# Patient Record
Sex: Female | Born: 1980 | Race: White | Hispanic: No | Marital: Married | Smoking: Never smoker
Health system: Southern US, Community
[De-identification: ages and names within clinical notes are randomized; demographics above are authoritative.]

## PROBLEM LIST (undated history)

## (undated) DIAGNOSIS — D649 Anemia, unspecified: Secondary | ICD-10-CM

## (undated) DIAGNOSIS — M419 Scoliosis, unspecified: Secondary | ICD-10-CM

## (undated) DIAGNOSIS — T7840XA Allergy, unspecified, initial encounter: Secondary | ICD-10-CM

## (undated) HISTORY — DX: Anemia, unspecified: D64.9

## (undated) HISTORY — PX: WISDOM TOOTH EXTRACTION: SHX21

## (undated) HISTORY — DX: Scoliosis, unspecified: M41.9

## (undated) HISTORY — DX: Allergy, unspecified, initial encounter: T78.40XA

---

## 2019-02-01 ENCOUNTER — Emergency Department (HOSPITAL_BASED_OUTPATIENT_CLINIC_OR_DEPARTMENT_OTHER)
Admission: EM | Admit: 2019-02-01 | Discharge: 2019-02-01 | Disposition: A | Discharge status: Home / Self Care | Payer: Commercial Managed Care - PPO | Attending: Emergency Medicine | Admitting: Emergency Medicine

## 2019-02-01 ENCOUNTER — Other Ambulatory Visit: Payer: Self-pay

## 2019-02-01 ENCOUNTER — Encounter (HOSPITAL_BASED_OUTPATIENT_CLINIC_OR_DEPARTMENT_OTHER): Payer: Self-pay | Admitting: *Deleted

## 2019-02-01 DIAGNOSIS — Z5321 Procedure and treatment not carried out due to patient leaving prior to being seen by health care provider: Secondary | ICD-10-CM | POA: Insufficient documentation

## 2019-02-01 DIAGNOSIS — R109 Unspecified abdominal pain: Secondary | ICD-10-CM | POA: Diagnosis not present

## 2019-02-01 LAB — CBC WITH DIFFERENTIAL/PLATELET
Abs Immature Granulocytes: 0.01 10*3/uL (ref 0.00–0.07)
Basophils Absolute: 0 10*3/uL (ref 0.0–0.1)
Basophils Relative: 1 %
Eosinophils Absolute: 0 10*3/uL (ref 0.0–0.5)
Eosinophils Relative: 0 %
HCT: 44.3 % (ref 36.0–46.0)
Hemoglobin: 14.7 g/dL (ref 12.0–15.0)
Immature Granulocytes: 0 %
Lymphocytes Relative: 27 %
Lymphs Abs: 1.5 10*3/uL (ref 0.7–4.0)
MCH: 29.7 pg (ref 26.0–34.0)
MCHC: 33.2 g/dL (ref 30.0–36.0)
MCV: 89.5 fL (ref 80.0–100.0)
Monocytes Absolute: 0.2 10*3/uL (ref 0.1–1.0)
Monocytes Relative: 3 %
Neutro Abs: 3.9 10*3/uL (ref 1.7–7.7)
Neutrophils Relative %: 69 %
Platelets: 358 10*3/uL (ref 150–400)
RBC: 4.95 MIL/uL (ref 3.87–5.11)
RDW: 12.6 % (ref 11.5–15.5)
WBC: 5.7 10*3/uL (ref 4.0–10.5)
nRBC: 0 % (ref 0.0–0.2)

## 2019-02-01 LAB — BASIC METABOLIC PANEL
Anion gap: 11 (ref 5–15)
BUN: 11 mg/dL (ref 6–20)
CO2: 23 mmol/L (ref 22–32)
Calcium: 8.9 mg/dL (ref 8.9–10.3)
Chloride: 104 mmol/L (ref 98–111)
Creatinine, Ser: 0.75 mg/dL (ref 0.44–1.00)
GFR calc Af Amer: >60 mL/min (ref >60)
GFR calc non Af Amer: >60 mL/min (ref >60)
Glucose, Bld: 99 mg/dL (ref 70–99)
Potassium: 3.6 mmol/L (ref 3.5–5.1)
Sodium: 138 mmol/L (ref 135–145)

## 2019-02-01 LAB — URINALYSIS, ROUTINE W REFLEX MICROSCOPIC
Bilirubin Urine: NEGATIVE
Glucose, UA: NEGATIVE mg/dL
Ketones, ur: NEGATIVE mg/dL
Leukocytes,Ua: NEGATIVE
Nitrite: NEGATIVE
Protein, ur: NEGATIVE mg/dL
Specific Gravity, Urine: >1.03 — ABNORMAL HIGH (ref 1.005–1.030)
pH: 6 (ref 5.0–8.0)

## 2019-02-01 LAB — LIPASE, BLOOD: Lipase: 27 U/L (ref 11–51)

## 2019-02-01 LAB — URINALYSIS, MICROSCOPIC (REFLEX)

## 2019-02-01 NOTE — ED Triage Notes (Signed)
Abdominal pain. No diarrhea. Covid exposure.

## 2019-02-04 ENCOUNTER — Other Ambulatory Visit: Payer: Self-pay | Admitting: Nurse Practitioner

## 2019-02-04 DIAGNOSIS — R1013 Epigastric pain: Secondary | ICD-10-CM

## 2019-02-10 ENCOUNTER — Other Ambulatory Visit: Payer: Commercial Managed Care - PPO

## 2019-02-11 ENCOUNTER — Other Ambulatory Visit: Payer: Self-pay

## 2019-02-11 ENCOUNTER — Ambulatory Visit (INDEPENDENT_AMBULATORY_CARE_PROVIDER_SITE_OTHER): Payer: Commercial Managed Care - PPO

## 2019-02-11 DIAGNOSIS — R1013 Epigastric pain: Secondary | ICD-10-CM | POA: Diagnosis not present

## 2019-03-05 ENCOUNTER — Other Ambulatory Visit: Payer: Self-pay

## 2019-03-05 NOTE — Progress Notes (Signed)
Opened in error. -EH/RMA  

## 2019-03-08 ENCOUNTER — Other Ambulatory Visit: Payer: Self-pay | Admitting: Surgery

## 2019-03-08 DIAGNOSIS — H5319 Other subjective visual disturbances: Secondary | ICD-10-CM

## 2019-03-17 ENCOUNTER — Other Ambulatory Visit: Payer: Self-pay | Admitting: Surgery

## 2019-04-14 ENCOUNTER — Other Ambulatory Visit: Payer: Self-pay

## 2019-04-14 ENCOUNTER — Ambulatory Visit
Admission: RE | Admit: 2019-04-14 | Discharge: 2019-04-14 | Disposition: A | Discharge status: Home / Self Care | Payer: Commercial Managed Care - PPO | Source: Ambulatory Visit | Attending: Surgery | Admitting: Surgery

## 2019-04-14 DIAGNOSIS — H5319 Other subjective visual disturbances: Secondary | ICD-10-CM

## 2019-04-14 MED ORDER — GADOBENATE DIMEGLUMINE 529 MG/ML IV SOLN
11.0000 mL | Freq: Once | INTRAVENOUS | Status: AC | PRN
  Administered 2019-04-14: 11 mL via INTRAVENOUS

## 2019-09-15 ENCOUNTER — Encounter: Payer: Self-pay | Admitting: Family Medicine

## 2019-09-15 ENCOUNTER — Telehealth (INDEPENDENT_AMBULATORY_CARE_PROVIDER_SITE_OTHER): Payer: Commercial Managed Care - PPO | Admitting: Family Medicine

## 2019-09-15 DIAGNOSIS — N946 Dysmenorrhea, unspecified: Secondary | ICD-10-CM | POA: Diagnosis not present

## 2019-09-15 DIAGNOSIS — Z1159 Encounter for screening for other viral diseases: Secondary | ICD-10-CM

## 2019-09-15 DIAGNOSIS — Z131 Encounter for screening for diabetes mellitus: Secondary | ICD-10-CM

## 2019-09-15 DIAGNOSIS — R1013 Epigastric pain: Secondary | ICD-10-CM | POA: Insufficient documentation

## 2019-09-15 DIAGNOSIS — Z13220 Encounter for screening for lipoid disorders: Secondary | ICD-10-CM | POA: Diagnosis not present

## 2019-09-15 MED ORDER — NORETHIN ACE-ETH ESTRAD-FE 1-20 MG-MCG PO TABS: 1.0000 | ORAL_TABLET | Freq: Every day | ORAL | 2 refills | Status: DC

## 2019-09-15 NOTE — Progress Notes (Signed)
Virtual Visit via Video Note I connected with Taylor Walter on 09/15/19 by a video enabled telemedicine application and verified that I am speaking with the correct person using two identifiers.  Location patient: home Location provider:work office Persons participating in the virtual visit: patient, provider  I discussed the limitations of evaluation and management by telemedicine and the availability of in person appointments. The patient expressed understanding and agreed to proceed.   HPI: Taylor Walter is a 39 yo female establishing care today. 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 She is otherwise healthy except for occasional epigastric abdominal pain q 1-2 years, recently had her 4th episode. She follows with GI.  No hx of diabetes,HLD,or HTN. She does not have any specific concerns today except for refills on OCP's. On OCP's since her 61's. Tolerating well, no side effects.  She takes medication continuously, no menstrual periods. Hx of dysmenorrhea, and OCP's help. G:0. Due for pap smear in 01/2020.  She would like to arrange blood work.  ROS: See pertinent positives and negatives per HPI.  History reviewed. No pertinent past medical history.  Past Surgical History:  Procedure Laterality Date  . WISDOM TOOTH EXTRACTION     History reviewed. No pertinent family history.  Social History   Socioeconomic History  . Marital status: Married    Spouse name: Not on file  . Number of children: Not on file  . Years of education: Not on file  . Highest education level: Not on file  Occupational History  . Not on file  Tobacco Use  . Smoking status: Never Smoker  . Smokeless tobacco: Never Used  Substance and Sexual Activity  . Alcohol use: Never  . Drug use: Never  . Sexual activity: Not on file  Other Topics Concern  . Not on file  Social History Narrative  . Not on file   Social Determinants of Health   Financial Resource Strain:   . Difficulty of Paying Living  Expenses: Not on file  Food Insecurity:   . Worried About Programme researcher, broadcasting/film/video in the Last Year: Not on file  . Ran Out of Food in the Last Year: Not on file  Transportation Needs:   . Lack of Transportation (Medical): Not on file  . Lack of Transportation (Non-Medical): Not on file  Physical Activity:   . Days of Exercise per Week: Not on file  . Minutes of Exercise per Session: Not on file  Stress:   . Feeling of Stress : Not on file  Social Connections:   . Frequency of Communication with Friends and Family: Not on file  . Frequency of Social Gatherings with Friends and Family: Not on file  . Attends Religious Services: Not on file  . Active Member of Clubs or Organizations: Not on file  . Attends Banker Meetings: Not on file  . Marital Status: Not on file  Intimate Partner Violence:   . Fear of Current or Ex-Partner: Not on file  . Emotionally Abused: Not on file  . Physically Abused: Not on file  . Sexually Abused: Not on file    Current Outpatient Medications:  .  norethindrone-ethinyl estradiol (LOESTRIN FE) 1-20 MG-MCG tablet, Take 1 tablet by mouth daily., Disp: 120 tablet, Rfl: 2  EXAM:  VITALS per patient if applicable:  GENERAL: alert, oriented, appears well and in no acute distress  HEENT: atraumatic, conjunctiva clear, no obvious abnormalities on inspection.  NECK: normal movements of the head and neck  LUNGS: on  inspection no signs of respiratory distress, breathing rate appears normal, no obvious gross SOB, gasping or wheezing  CV: no obvious cyanosis  Taylor: moves all visible extremities without noticeable abnormality  PSYCH/NEURO: pleasant and cooperative, no obvious depression or anxiety, speech and thought processing grossly intact  ASSESSMENT AND PLAN:  Discussed the following assessment and plan:  Dysmenorrhea - Plan: norethindrone-ethinyl estradiol (LOESTRIN FE) 1-20 MG-MCG tablet Problem is well controlled with current  management. She understands possible side effects. No changes in current management.  Screening for lipid disorders - Plan: Lipid panel  Diabetes mellitus screening - Plan: BASIC METABOLIC PANEL WITH GFR  Encounter for HCV screening test for low risk patient - Plan: Hepatitis C antibody  Epigastric pain Unknown etiology. Following with GI.   I discussed the assessment and treatment plan with the patient. Taylor Dewolfe was provided an opportunity to ask questions and all were answered. She agreed with the plan and demonstrated an understanding of the instructions.    Return for Fastig blood work. CPE in January or feb next year. .    Denyse Fillion [place], MD

## 2019-09-16 ENCOUNTER — Encounter: Payer: Self-pay | Admitting: Family Medicine

## 2019-10-01 ENCOUNTER — Telehealth: Payer: Self-pay | Admitting: Family Medicine

## 2019-10-01 ENCOUNTER — Other Ambulatory Visit: Payer: Commercial Managed Care - PPO

## 2019-10-01 NOTE — Telephone Encounter (Signed)
Pt wants to know if she can have her lab orders made so she can go to any [facility] to have them done. That is the closes to her.  Please advise

## 2019-10-01 NOTE — Telephone Encounter (Signed)
Faxed lab orders to location below & received confirmation that fax was received.

## 2019-10-01 NOTE — Telephone Encounter (Signed)
Pt called back and said she would like the orders faxed to   [facility]  [address] [facility] [address] [place]  Fax....[phone]  Please

## 2019-10-01 NOTE — Telephone Encounter (Signed)
Lvm for pt asking for her to call back to let us know which [facility] she wants to use. I will fax the orders.

## 2019-10-01 NOTE — Addendum Note (Signed)
Addended by: Kathreen Devoid on: 10/01/2019 08:43 AM   Modules accepted: Orders

## 2019-10-15 ENCOUNTER — Other Ambulatory Visit: Payer: Self-pay | Admitting: Family Medicine

## 2019-10-16 LAB — BASIC METABOLIC PANEL
BUN/Creatinine Ratio: 9 (ref 9–23)
BUN: 7 mg/dL (ref 6–20)
CO2: 27 mmol/L (ref 20–29)
Calcium: 9.2 mg/dL (ref 8.7–10.2)
Chloride: 102 mmol/L (ref 96–106)
Creatinine, Ser: 0.78 mg/dL (ref 0.57–1.00)
GFR calc Af Amer: 113 mL/min/{1.73_m2} (ref >59)
GFR calc non Af Amer: 98 mL/min/{1.73_m2} (ref >59)
Glucose: 84 mg/dL (ref 65–99)
Potassium: 4.3 mmol/L (ref 3.5–5.2)
Sodium: 140 mmol/L (ref 134–144)

## 2019-10-16 LAB — LIPID PANEL
Chol/HDL Ratio: 4.3 ratio (ref 0.0–4.4)
Cholesterol, Total: 253 mg/dL — ABNORMAL HIGH (ref 100–199)
HDL: 59 mg/dL (ref >39)
LDL Chol Calc (NIH): 172 mg/dL — ABNORMAL HIGH (ref 0–99)
Triglycerides: 125 mg/dL (ref 0–149)
VLDL Cholesterol Cal: 22 mg/dL (ref 5–40)

## 2019-10-16 LAB — HEPATITIS C ANTIBODY: Hep C Virus Ab: <0.1 s/co ratio (ref 0.0–0.9)

## 2019-10-25 ENCOUNTER — Encounter: Payer: Self-pay | Admitting: Family Medicine

## 2019-10-26 ENCOUNTER — Encounter: Payer: Self-pay | Admitting: Family Medicine

## 2020-03-23 DIAGNOSIS — Z20822 Contact with and (suspected) exposure to covid-19: Secondary | ICD-10-CM | POA: Diagnosis not present

## 2020-04-07 DIAGNOSIS — Z30431 Encounter for routine checking of intrauterine contraceptive device: Secondary | ICD-10-CM | POA: Diagnosis not present

## 2020-04-07 DIAGNOSIS — R87615 Unsatisfactory cytologic smear of cervix: Secondary | ICD-10-CM | POA: Diagnosis not present

## 2020-06-05 DIAGNOSIS — F4323 Adjustment disorder with mixed anxiety and depressed mood: Secondary | ICD-10-CM | POA: Diagnosis not present

## 2020-06-12 DIAGNOSIS — F4323 Adjustment disorder with mixed anxiety and depressed mood: Secondary | ICD-10-CM | POA: Diagnosis not present

## 2020-06-26 DIAGNOSIS — F4323 Adjustment disorder with mixed anxiety and depressed mood: Secondary | ICD-10-CM | POA: Diagnosis not present

## 2020-06-30 DIAGNOSIS — F411 Generalized anxiety disorder: Secondary | ICD-10-CM | POA: Diagnosis not present

## 2020-07-05 DIAGNOSIS — F411 Generalized anxiety disorder: Secondary | ICD-10-CM | POA: Diagnosis not present

## 2020-07-10 DIAGNOSIS — F411 Generalized anxiety disorder: Secondary | ICD-10-CM | POA: Diagnosis not present

## 2020-07-17 DIAGNOSIS — D225 Melanocytic nevi of trunk: Secondary | ICD-10-CM | POA: Diagnosis not present

## 2020-07-17 DIAGNOSIS — D2261 Melanocytic nevi of right upper limb, including shoulder: Secondary | ICD-10-CM | POA: Diagnosis not present

## 2020-07-17 DIAGNOSIS — L814 Other melanin hyperpigmentation: Secondary | ICD-10-CM | POA: Diagnosis not present

## 2020-07-17 DIAGNOSIS — F4323 Adjustment disorder with mixed anxiety and depressed mood: Secondary | ICD-10-CM | POA: Diagnosis not present

## 2020-07-18 DIAGNOSIS — F411 Generalized anxiety disorder: Secondary | ICD-10-CM | POA: Diagnosis not present

## 2020-07-21 DIAGNOSIS — Z111 Encounter for screening for respiratory tuberculosis: Secondary | ICD-10-CM | POA: Diagnosis not present

## 2020-07-21 DIAGNOSIS — Z0000 Encounter for general adult medical examination without abnormal findings: Secondary | ICD-10-CM | POA: Diagnosis not present

## 2020-07-21 DIAGNOSIS — Z6820 Body mass index (BMI) 20.0-20.9, adult: Secondary | ICD-10-CM | POA: Diagnosis not present

## 2020-07-24 DIAGNOSIS — F411 Generalized anxiety disorder: Secondary | ICD-10-CM | POA: Diagnosis not present

## 2020-08-03 DIAGNOSIS — F411 Generalized anxiety disorder: Secondary | ICD-10-CM | POA: Diagnosis not present

## 2020-08-07 DIAGNOSIS — F4323 Adjustment disorder with mixed anxiety and depressed mood: Secondary | ICD-10-CM | POA: Diagnosis not present

## 2020-08-08 DIAGNOSIS — F411 Generalized anxiety disorder: Secondary | ICD-10-CM | POA: Diagnosis not present

## 2020-08-21 DIAGNOSIS — F411 Generalized anxiety disorder: Secondary | ICD-10-CM | POA: Diagnosis not present

## 2020-08-28 DIAGNOSIS — F411 Generalized anxiety disorder: Secondary | ICD-10-CM | POA: Diagnosis not present

## 2020-09-04 DIAGNOSIS — F4323 Adjustment disorder with mixed anxiety and depressed mood: Secondary | ICD-10-CM | POA: Diagnosis not present

## 2020-09-11 DIAGNOSIS — F411 Generalized anxiety disorder: Secondary | ICD-10-CM | POA: Diagnosis not present

## 2020-09-18 DIAGNOSIS — F4323 Adjustment disorder with mixed anxiety and depressed mood: Secondary | ICD-10-CM | POA: Diagnosis not present

## 2020-09-22 DIAGNOSIS — F419 Anxiety disorder, unspecified: Secondary | ICD-10-CM | POA: Diagnosis not present

## 2020-09-28 DIAGNOSIS — F419 Anxiety disorder, unspecified: Secondary | ICD-10-CM | POA: Diagnosis not present

## 2020-09-30 DIAGNOSIS — Z20822 Contact with and (suspected) exposure to covid-19: Secondary | ICD-10-CM | POA: Diagnosis not present

## 2020-10-03 DIAGNOSIS — F419 Anxiety disorder, unspecified: Secondary | ICD-10-CM | POA: Diagnosis not present

## 2020-10-04 DIAGNOSIS — D2261 Melanocytic nevi of right upper limb, including shoulder: Secondary | ICD-10-CM | POA: Diagnosis not present

## 2020-10-04 DIAGNOSIS — L905 Scar conditions and fibrosis of skin: Secondary | ICD-10-CM | POA: Diagnosis not present

## 2020-10-16 DIAGNOSIS — M7918 Myalgia, other site: Secondary | ICD-10-CM | POA: Diagnosis not present

## 2020-10-16 DIAGNOSIS — M9903 Segmental and somatic dysfunction of lumbar region: Secondary | ICD-10-CM | POA: Diagnosis not present

## 2020-10-16 DIAGNOSIS — M9905 Segmental and somatic dysfunction of pelvic region: Secondary | ICD-10-CM | POA: Diagnosis not present

## 2020-10-16 DIAGNOSIS — M9904 Segmental and somatic dysfunction of sacral region: Secondary | ICD-10-CM | POA: Diagnosis not present

## 2020-10-16 DIAGNOSIS — F4323 Adjustment disorder with mixed anxiety and depressed mood: Secondary | ICD-10-CM | POA: Diagnosis not present

## 2020-10-19 DIAGNOSIS — M9905 Segmental and somatic dysfunction of pelvic region: Secondary | ICD-10-CM | POA: Diagnosis not present

## 2020-10-19 DIAGNOSIS — M9903 Segmental and somatic dysfunction of lumbar region: Secondary | ICD-10-CM | POA: Diagnosis not present

## 2020-10-19 DIAGNOSIS — M7918 Myalgia, other site: Secondary | ICD-10-CM | POA: Diagnosis not present

## 2020-10-19 DIAGNOSIS — M9904 Segmental and somatic dysfunction of sacral region: Secondary | ICD-10-CM | POA: Diagnosis not present

## 2020-10-23 ENCOUNTER — Ambulatory Visit: Payer: Self-pay | Attending: Internal Medicine

## 2020-10-23 DIAGNOSIS — F4323 Adjustment disorder with mixed anxiety and depressed mood: Secondary | ICD-10-CM | POA: Diagnosis not present

## 2020-10-23 DIAGNOSIS — M9903 Segmental and somatic dysfunction of lumbar region: Secondary | ICD-10-CM | POA: Diagnosis not present

## 2020-10-23 DIAGNOSIS — Z23 Encounter for immunization: Secondary | ICD-10-CM

## 2020-10-23 DIAGNOSIS — M9904 Segmental and somatic dysfunction of sacral region: Secondary | ICD-10-CM | POA: Diagnosis not present

## 2020-10-23 DIAGNOSIS — M7918 Myalgia, other site: Secondary | ICD-10-CM | POA: Diagnosis not present

## 2020-10-23 DIAGNOSIS — M9905 Segmental and somatic dysfunction of pelvic region: Secondary | ICD-10-CM | POA: Diagnosis not present

## 2020-10-23 NOTE — Progress Notes (Signed)
   Covid-19 Vaccination Clinic  Name:  Taylor Walter    MRN: [id number] DOB: 1980/04/06  10/23/2020  [name] was observed post Covid-19 immunization for 15 minutes without incident. She was provided with Vaccine Information Sheet and instruction to access the V-Safe system.   [name] was instructed to call 911 with any severe reactions post vaccine: Difficulty breathing  Swelling of face and throat  A fast heartbeat  A bad rash all over body  Dizziness and weakness

## 2020-10-24 DIAGNOSIS — F419 Anxiety disorder, unspecified: Secondary | ICD-10-CM | POA: Diagnosis not present

## 2020-10-26 DIAGNOSIS — M7918 Myalgia, other site: Secondary | ICD-10-CM | POA: Diagnosis not present

## 2020-10-26 DIAGNOSIS — M9903 Segmental and somatic dysfunction of lumbar region: Secondary | ICD-10-CM | POA: Diagnosis not present

## 2020-10-26 DIAGNOSIS — M9904 Segmental and somatic dysfunction of sacral region: Secondary | ICD-10-CM | POA: Diagnosis not present

## 2020-10-26 DIAGNOSIS — M9905 Segmental and somatic dysfunction of pelvic region: Secondary | ICD-10-CM | POA: Diagnosis not present

## 2020-10-31 ENCOUNTER — Other Ambulatory Visit (HOSPITAL_BASED_OUTPATIENT_CLINIC_OR_DEPARTMENT_OTHER): Payer: Self-pay

## 2020-10-31 MED ORDER — MODERNA COVID-19 BIVAL BOOSTER 50 MCG/0.5ML IM SUSP
INTRAMUSCULAR | 0 refills | Status: DC
  Filled 2020-10-31: qty 0.5, 1d supply, fill #0

## 2020-11-06 DIAGNOSIS — F4323 Adjustment disorder with mixed anxiety and depressed mood: Secondary | ICD-10-CM | POA: Diagnosis not present

## 2020-11-20 DIAGNOSIS — F4323 Adjustment disorder with mixed anxiety and depressed mood: Secondary | ICD-10-CM | POA: Diagnosis not present

## 2020-11-23 DIAGNOSIS — F419 Anxiety disorder, unspecified: Secondary | ICD-10-CM | POA: Diagnosis not present

## 2020-11-27 ENCOUNTER — Other Ambulatory Visit (HOSPITAL_BASED_OUTPATIENT_CLINIC_OR_DEPARTMENT_OTHER): Payer: Self-pay

## 2020-11-27 MED ORDER — INFLUENZA VAC SPLIT QUAD 0.5 ML IM SUSY
PREFILLED_SYRINGE | INTRAMUSCULAR | 0 refills | Status: DC
  Filled 2020-11-27: qty 0.5, 1d supply, fill #0

## 2020-11-28 DIAGNOSIS — M9905 Segmental and somatic dysfunction of pelvic region: Secondary | ICD-10-CM | POA: Diagnosis not present

## 2020-11-28 DIAGNOSIS — M9903 Segmental and somatic dysfunction of lumbar region: Secondary | ICD-10-CM | POA: Diagnosis not present

## 2020-11-28 DIAGNOSIS — M7918 Myalgia, other site: Secondary | ICD-10-CM | POA: Diagnosis not present

## 2020-11-28 DIAGNOSIS — M9904 Segmental and somatic dysfunction of sacral region: Secondary | ICD-10-CM | POA: Diagnosis not present

## 2020-11-30 DIAGNOSIS — F419 Anxiety disorder, unspecified: Secondary | ICD-10-CM | POA: Diagnosis not present

## 2020-11-30 DIAGNOSIS — M9903 Segmental and somatic dysfunction of lumbar region: Secondary | ICD-10-CM | POA: Diagnosis not present

## 2020-11-30 DIAGNOSIS — M7918 Myalgia, other site: Secondary | ICD-10-CM | POA: Diagnosis not present

## 2020-11-30 DIAGNOSIS — M9904 Segmental and somatic dysfunction of sacral region: Secondary | ICD-10-CM | POA: Diagnosis not present

## 2020-11-30 DIAGNOSIS — M9905 Segmental and somatic dysfunction of pelvic region: Secondary | ICD-10-CM | POA: Diagnosis not present

## 2020-12-07 DIAGNOSIS — M9905 Segmental and somatic dysfunction of pelvic region: Secondary | ICD-10-CM | POA: Diagnosis not present

## 2020-12-07 DIAGNOSIS — M7918 Myalgia, other site: Secondary | ICD-10-CM | POA: Diagnosis not present

## 2020-12-07 DIAGNOSIS — M9903 Segmental and somatic dysfunction of lumbar region: Secondary | ICD-10-CM | POA: Diagnosis not present

## 2020-12-07 DIAGNOSIS — M9904 Segmental and somatic dysfunction of sacral region: Secondary | ICD-10-CM | POA: Diagnosis not present

## 2020-12-11 DIAGNOSIS — F4323 Adjustment disorder with mixed anxiety and depressed mood: Secondary | ICD-10-CM | POA: Diagnosis not present

## 2020-12-18 DIAGNOSIS — M9904 Segmental and somatic dysfunction of sacral region: Secondary | ICD-10-CM | POA: Diagnosis not present

## 2020-12-18 DIAGNOSIS — M9903 Segmental and somatic dysfunction of lumbar region: Secondary | ICD-10-CM | POA: Diagnosis not present

## 2020-12-18 DIAGNOSIS — M9905 Segmental and somatic dysfunction of pelvic region: Secondary | ICD-10-CM | POA: Diagnosis not present

## 2020-12-18 DIAGNOSIS — F4323 Adjustment disorder with mixed anxiety and depressed mood: Secondary | ICD-10-CM | POA: Diagnosis not present

## 2020-12-18 DIAGNOSIS — M7918 Myalgia, other site: Secondary | ICD-10-CM | POA: Diagnosis not present

## 2020-12-21 DIAGNOSIS — M7918 Myalgia, other site: Secondary | ICD-10-CM | POA: Diagnosis not present

## 2020-12-21 DIAGNOSIS — M9905 Segmental and somatic dysfunction of pelvic region: Secondary | ICD-10-CM | POA: Diagnosis not present

## 2020-12-21 DIAGNOSIS — M9904 Segmental and somatic dysfunction of sacral region: Secondary | ICD-10-CM | POA: Diagnosis not present

## 2020-12-21 DIAGNOSIS — M9903 Segmental and somatic dysfunction of lumbar region: Secondary | ICD-10-CM | POA: Diagnosis not present

## 2020-12-21 DIAGNOSIS — F419 Anxiety disorder, unspecified: Secondary | ICD-10-CM | POA: Diagnosis not present

## 2020-12-25 DIAGNOSIS — M9904 Segmental and somatic dysfunction of sacral region: Secondary | ICD-10-CM | POA: Diagnosis not present

## 2020-12-25 DIAGNOSIS — M9903 Segmental and somatic dysfunction of lumbar region: Secondary | ICD-10-CM | POA: Diagnosis not present

## 2020-12-25 DIAGNOSIS — M7918 Myalgia, other site: Secondary | ICD-10-CM | POA: Diagnosis not present

## 2020-12-25 DIAGNOSIS — M9905 Segmental and somatic dysfunction of pelvic region: Secondary | ICD-10-CM | POA: Diagnosis not present

## 2020-12-25 DIAGNOSIS — F4323 Adjustment disorder with mixed anxiety and depressed mood: Secondary | ICD-10-CM | POA: Diagnosis not present

## 2020-12-28 DIAGNOSIS — F419 Anxiety disorder, unspecified: Secondary | ICD-10-CM | POA: Diagnosis not present

## 2020-12-28 DIAGNOSIS — M9903 Segmental and somatic dysfunction of lumbar region: Secondary | ICD-10-CM | POA: Diagnosis not present

## 2020-12-28 DIAGNOSIS — M9904 Segmental and somatic dysfunction of sacral region: Secondary | ICD-10-CM | POA: Diagnosis not present

## 2020-12-28 DIAGNOSIS — M9905 Segmental and somatic dysfunction of pelvic region: Secondary | ICD-10-CM | POA: Diagnosis not present

## 2020-12-28 DIAGNOSIS — M7918 Myalgia, other site: Secondary | ICD-10-CM | POA: Diagnosis not present

## 2021-01-01 DIAGNOSIS — M9905 Segmental and somatic dysfunction of pelvic region: Secondary | ICD-10-CM | POA: Diagnosis not present

## 2021-01-01 DIAGNOSIS — F4323 Adjustment disorder with mixed anxiety and depressed mood: Secondary | ICD-10-CM | POA: Diagnosis not present

## 2021-01-01 DIAGNOSIS — M9903 Segmental and somatic dysfunction of lumbar region: Secondary | ICD-10-CM | POA: Diagnosis not present

## 2021-01-01 DIAGNOSIS — M7918 Myalgia, other site: Secondary | ICD-10-CM | POA: Diagnosis not present

## 2021-01-01 DIAGNOSIS — M9904 Segmental and somatic dysfunction of sacral region: Secondary | ICD-10-CM | POA: Diagnosis not present

## 2021-01-04 DIAGNOSIS — F419 Anxiety disorder, unspecified: Secondary | ICD-10-CM | POA: Diagnosis not present

## 2021-01-08 DIAGNOSIS — M9904 Segmental and somatic dysfunction of sacral region: Secondary | ICD-10-CM | POA: Diagnosis not present

## 2021-01-08 DIAGNOSIS — M9903 Segmental and somatic dysfunction of lumbar region: Secondary | ICD-10-CM | POA: Diagnosis not present

## 2021-01-08 DIAGNOSIS — F4323 Adjustment disorder with mixed anxiety and depressed mood: Secondary | ICD-10-CM | POA: Diagnosis not present

## 2021-01-08 DIAGNOSIS — M9905 Segmental and somatic dysfunction of pelvic region: Secondary | ICD-10-CM | POA: Diagnosis not present

## 2021-01-08 DIAGNOSIS — M7918 Myalgia, other site: Secondary | ICD-10-CM | POA: Diagnosis not present

## 2021-01-09 DIAGNOSIS — F419 Anxiety disorder, unspecified: Secondary | ICD-10-CM | POA: Diagnosis not present

## 2021-01-11 DIAGNOSIS — M9904 Segmental and somatic dysfunction of sacral region: Secondary | ICD-10-CM | POA: Diagnosis not present

## 2021-01-11 DIAGNOSIS — M9905 Segmental and somatic dysfunction of pelvic region: Secondary | ICD-10-CM | POA: Diagnosis not present

## 2021-01-22 DIAGNOSIS — F4323 Adjustment disorder with mixed anxiety and depressed mood: Secondary | ICD-10-CM | POA: Diagnosis not present

## 2021-01-25 DIAGNOSIS — F419 Anxiety disorder, unspecified: Secondary | ICD-10-CM | POA: Diagnosis not present

## 2021-01-29 DIAGNOSIS — F4323 Adjustment disorder with mixed anxiety and depressed mood: Secondary | ICD-10-CM | POA: Diagnosis not present

## 2021-02-01 DIAGNOSIS — F419 Anxiety disorder, unspecified: Secondary | ICD-10-CM | POA: Diagnosis not present

## 2021-02-19 DIAGNOSIS — F4323 Adjustment disorder with mixed anxiety and depressed mood: Secondary | ICD-10-CM | POA: Diagnosis not present

## 2021-03-01 DIAGNOSIS — F419 Anxiety disorder, unspecified: Secondary | ICD-10-CM | POA: Diagnosis not present

## 2021-03-05 DIAGNOSIS — F4323 Adjustment disorder with mixed anxiety and depressed mood: Secondary | ICD-10-CM | POA: Diagnosis not present

## 2021-04-05 DIAGNOSIS — F4323 Adjustment disorder with mixed anxiety and depressed mood: Secondary | ICD-10-CM | POA: Diagnosis not present

## 2021-04-11 DIAGNOSIS — L814 Other melanin hyperpigmentation: Secondary | ICD-10-CM | POA: Diagnosis not present

## 2021-04-11 DIAGNOSIS — D485 Neoplasm of uncertain behavior of skin: Secondary | ICD-10-CM | POA: Diagnosis not present

## 2021-04-11 DIAGNOSIS — D2272 Melanocytic nevi of left lower limb, including hip: Secondary | ICD-10-CM | POA: Diagnosis not present

## 2021-04-11 DIAGNOSIS — L821 Other seborrheic keratosis: Secondary | ICD-10-CM | POA: Diagnosis not present

## 2021-04-11 DIAGNOSIS — D225 Melanocytic nevi of trunk: Secondary | ICD-10-CM | POA: Diagnosis not present

## 2021-04-19 DIAGNOSIS — F4323 Adjustment disorder with mixed anxiety and depressed mood: Secondary | ICD-10-CM | POA: Diagnosis not present

## 2021-05-03 DIAGNOSIS — F4323 Adjustment disorder with mixed anxiety and depressed mood: Secondary | ICD-10-CM | POA: Diagnosis not present

## 2021-05-28 DIAGNOSIS — F4323 Adjustment disorder with mixed anxiety and depressed mood: Secondary | ICD-10-CM | POA: Diagnosis not present

## 2021-06-25 DIAGNOSIS — F4323 Adjustment disorder with mixed anxiety and depressed mood: Secondary | ICD-10-CM | POA: Diagnosis not present

## 2021-07-17 ENCOUNTER — Encounter: Payer: Self-pay | Admitting: Obstetrics and Gynecology

## 2021-07-24 DIAGNOSIS — U099 Post covid-19 condition, unspecified: Secondary | ICD-10-CM | POA: Insufficient documentation

## 2021-07-24 DIAGNOSIS — I4711 Inappropriate sinus tachycardia, so stated: Secondary | ICD-10-CM | POA: Insufficient documentation

## 2021-07-30 DIAGNOSIS — F4323 Adjustment disorder with mixed anxiety and depressed mood: Secondary | ICD-10-CM | POA: Diagnosis not present

## 2021-09-07 ENCOUNTER — Encounter: Payer: Self-pay | Admitting: Obstetrics and Gynecology

## 2021-09-07 ENCOUNTER — Ambulatory Visit (INDEPENDENT_AMBULATORY_CARE_PROVIDER_SITE_OTHER): Payer: BC Managed Care – PPO | Admitting: Obstetrics and Gynecology

## 2021-09-07 VITALS — BP 125/83 | HR 73 | Wt 127.0 lb

## 2021-09-07 DIAGNOSIS — Z01419 Encounter for gynecological examination (general) (routine) without abnormal findings: Secondary | ICD-10-CM | POA: Diagnosis not present

## 2021-09-07 MED ORDER — BUPROPION HCL ER (XL) 150 MG PO TB24: 150.0000 mg | ORAL_TABLET | Freq: Every day | ORAL | 2 refills | Status: DC

## 2021-09-07 NOTE — Progress Notes (Signed)
GYNECOLOGY ANNUAL PREVENTATIVE CARE ENCOUNTER NOTE  History:     Taylor Walter is a 41 y.o. G0P0000 female here for a routine annual gynecologic exam.  Current complaints: seasonal depression. Symptoms improve at the start of spring. She has tried light bulbs to help give more natural sunlight. She has a good exercise program. She is interested in doing a trial of Wellbutrin for symptom management.    Denies abnormal vaginal bleeding, discharge, pelvic pain, problems with intercourse or other gynecologic concerns.    Gynecologic History No LMP recorded. (Menstrual status: IUD). Contraception: IUD Last Pap: 2021. Result was normal with negative HPV Last Mammogram: NA  Obstetric History OB History  Gravida Para Term Preterm AB Living  0 0 0 0 0 0  SAB IAB Ectopic Multiple Live Births  0 0 0 0 0    Past Medical History:  Diagnosis Date   Scoliosis     Past Surgical History:  Procedure Laterality Date   WISDOM TOOTH EXTRACTION      Current Outpatient Medications on File Prior to Visit  Medication Sig Dispense Refill   drospirenone-ethinyl estradiol (YAZ) 3-0.02 MG tablet Take 1 tablet by mouth daily.     levonorgestrel (MIRENA) 20 MCG/DAY IUD 1 each by Intrauterine route once.     No current facility-administered medications on file prior to visit.    Allergies  Allergen Reactions   Latex Itching and Other (See Comments)    Mouth irritation Other reaction(s): Dermatitis Mouth irritation     Social History:  reports that she has never smoked. She has never used smokeless tobacco. She reports that she does not drink alcohol and does not use drugs.  Family History  Problem Relation Age of Onset   Hypertension Mother    Melanoma Father    Pancreatic cancer Maternal Grandmother    Aortic aneurysm Maternal Grandfather    Colon cancer Paternal Grandfather     The following portions of the patient's history were reviewed and updated as appropriate: allergies,  current medications, past family history, past medical history, past social history, past surgical history and problem list.  Review of Systems Pertinent items noted in HPI and remainder of comprehensive ROS otherwise negative.  Physical Exam:  BP 125/83   Pulse 73   Wt 127 lb (57.6 kg)   BMI 22.50 kg/m  CONSTITUTIONAL: Well-developed, well-nourished female in no acute distress.  HENT:  Normocephalic, atraumatic, External right and left ear normal.  EYES: Conjunctivae and EOM are normal. Pupils are equal, round, and reactive to light. No scleral icterus.  NECK: Normal range of motion, supple, no masses.  Normal thyroid.  SKIN: Skin is warm and dry. No rash noted. Not diaphoretic. No erythema. No pallor. MUSCULOSKELETAL: Normal range of motion. No tenderness.  No cyanosis, clubbing, or edema. NEUROLOGIC: Alert and oriented to person, place, and time. Normal reflexes, muscle tone coordination.  PSYCHIATRIC: Normal mood and affect. Normal behavior. Normal judgment and thought content. CARDIOVASCULAR: Normal heart rate noted, regular rhythm RESPIRATORY: Clear to auscultation bilaterally. Effort and breath sounds normal, no problems with respiration noted. BREASTS: Symmetric in size. No masses, tenderness, skin changes, nipple drainage, or lymphadenopathy bilaterally. Performed in the presence of a chaperone. ABDOMEN: Soft, no distention noted.  No tenderness, rebound or guarding.  PELVIC: Deferred      1. Women's annual routine gynecological examination  - Rx: Welbutrin 150 mg XL PO daily  - Close contact with the office if worsening symptoms once medication is started.  -  Pap records requested  - Lipid Profile     Routine preventative health maintenance measures emphasized. Please refer to After Visit Summary for other counseling recommendations.     Ovie Eastep, Harolyn Rutherford, NP  [facility] [employer], [facility]

## 2021-09-08 LAB — LIPID PANEL
Cholesterol: 256 mg/dL — ABNORMAL HIGH (ref <200)
HDL: 69 mg/dL (ref >=50)
LDL Cholesterol (Calc): 155 mg/dL (calc) — ABNORMAL HIGH
Non-HDL Cholesterol (Calc): 187 mg/dL (calc) — ABNORMAL HIGH (ref <130)
Total CHOL/HDL Ratio: 3.7 (calc) (ref <5.0)
Triglycerides: 180 mg/dL — ABNORMAL HIGH (ref <150)

## 2021-09-10 DIAGNOSIS — F4323 Adjustment disorder with mixed anxiety and depressed mood: Secondary | ICD-10-CM | POA: Diagnosis not present

## 2021-10-15 DIAGNOSIS — D225 Melanocytic nevi of trunk: Secondary | ICD-10-CM | POA: Diagnosis not present

## 2021-10-15 DIAGNOSIS — L821 Other seborrheic keratosis: Secondary | ICD-10-CM | POA: Diagnosis not present

## 2021-10-15 DIAGNOSIS — Z872 Personal history of diseases of the skin and subcutaneous tissue: Secondary | ICD-10-CM | POA: Diagnosis not present

## 2021-10-15 DIAGNOSIS — L814 Other melanin hyperpigmentation: Secondary | ICD-10-CM | POA: Diagnosis not present

## 2021-11-16 IMAGING — MR MR HEAD WO/W CM
12 series · 48 of 48 positions shown · IV contrast (multihance)
Comparison: None.

CLINICAL DATA: Subjective visual disturbances

EXAM:
MRI HEAD WITHOUT AND WITH CONTRAST
TECHNIQUE: Multiplanar, multiecho pulse sequences of the brain and surrounding
structures were obtained without and with intravenous contrast.
CONTRAST:  11mL MULTIHANCE GADOBENATE DIMEGLUMINE 529 MG/ML IV SOLN

[Series 2: T1 · sagittal · 5.0mm · 0.45mm/px · 2 of 23 slices shown]
[im 1/23]
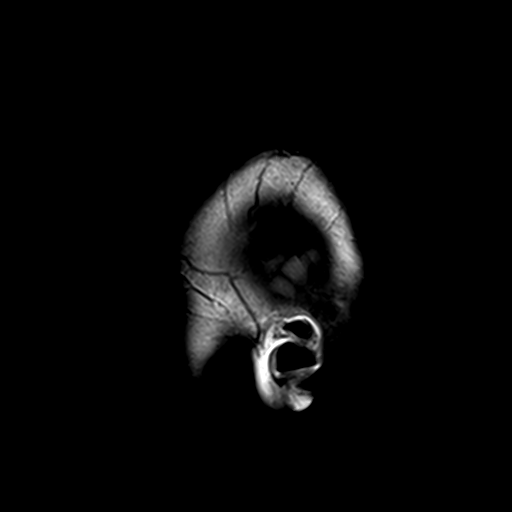
[im 23/23]
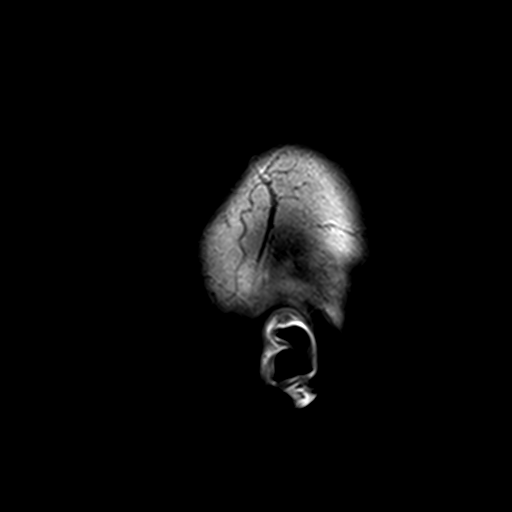

[Series 3: DWI · axial · 3.0mm · 1.80mm/px · z∈[-60,+84]mm · 7 of 100 slices shown (1 of 4)]
[im 1/100]
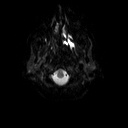
[im 17/100]
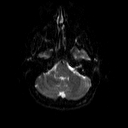
[im 34/100]
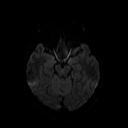
[im 50/100]
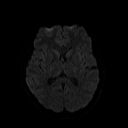
[im 67/100]
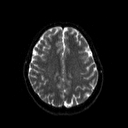
[im 83/100]
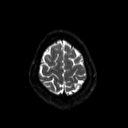
[im 100/100]
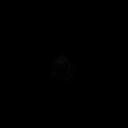

[Series 4: DWI · axial · 3.0mm · 1.80mm/px · z∈[-60,+84]mm · 3 of 49 slices shown (2 of 4)]
[im 1/49]
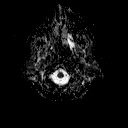
[im 25/49]
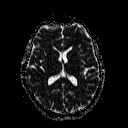
[im 49/49]
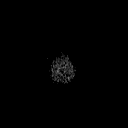

[Series 5: DWI · coronal · 5.0mm · 1.80mm/px · 5 of 71 slices shown (3 of 4)]
[im 1/71]
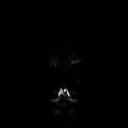
[im 18/71]
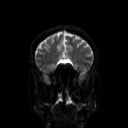
[im 36/71]
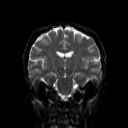
[im 53/71]
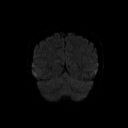
[im 71/71]
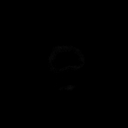

[Series 6: DWI · coronal · 5.0mm · 1.80mm/px · 2 of 36 slices shown (4 of 4)]
[im 1/36]
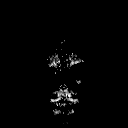
[im 36/36]
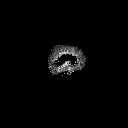

[Series 7: T2 · axial · 5.0mm · 0.51mm/px · 1 of 22 slices shown (1 of 2)]
[im 1/22]
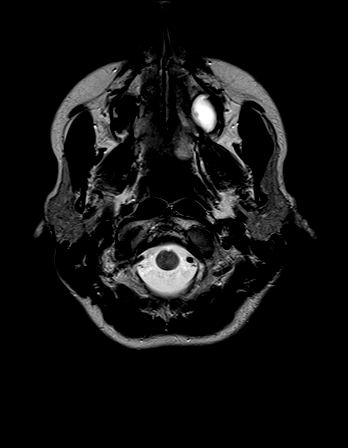

[Series 8: FLAIR · axial · 3.0mm · 0.45mm/px · z∈[-63,+81]mm · 2 of 33 slices shown]
[im 1/33]
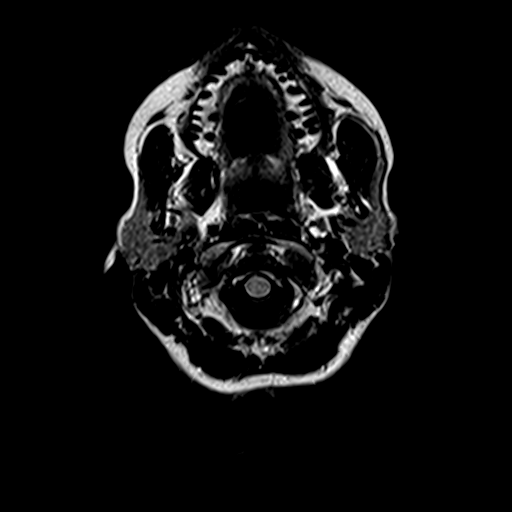
[im 33/33]
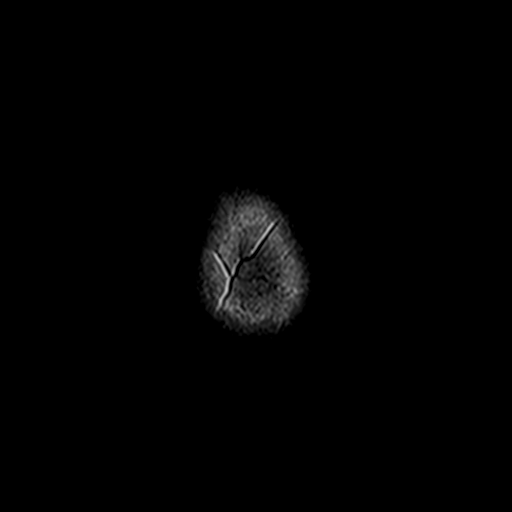

[Series 10: swi_images · axial · 4.0mm · 0.90mm/px · z∈[-56,+82]mm · 2 of 36 slices shown]
[im 1/36]
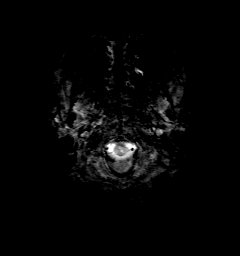
[im 36/36]
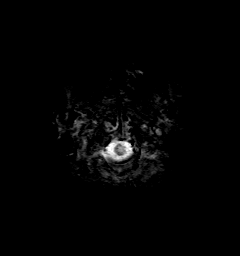

[Series 11: t1_mpr_tra · axial · 1.0mm · 0.75mm/px · z∈[-64,+82]mm · 10 of 144 slices shown (1 of 2)]
[im 1/144]
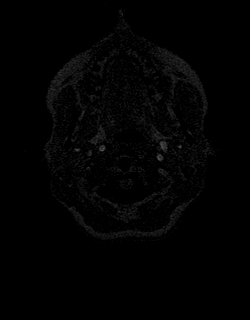
[im 16/144]
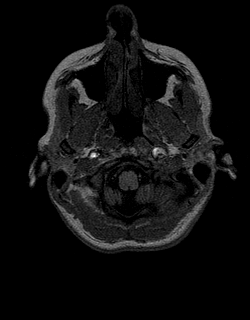
[im 32/144]
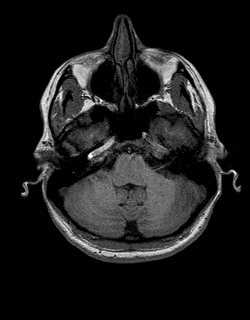
[im 48/144]
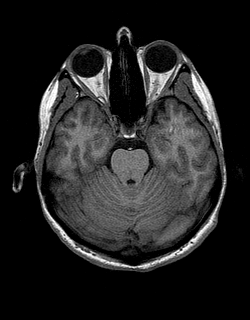
[im 64/144]
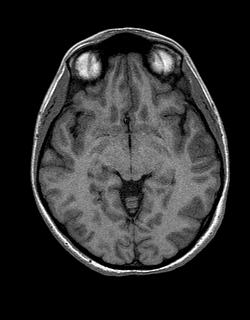
[im 80/144]
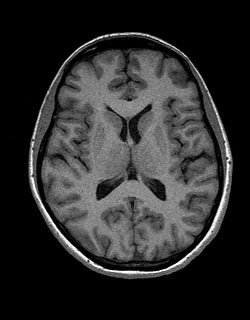
[im 96/144]
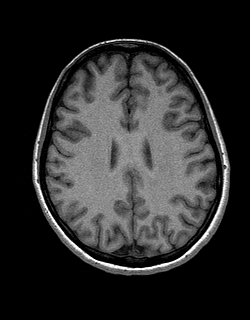
[im 112/144]
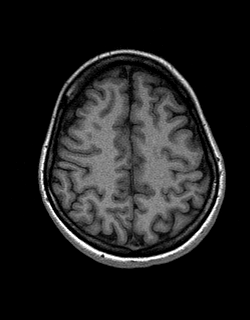
[im 128/144]
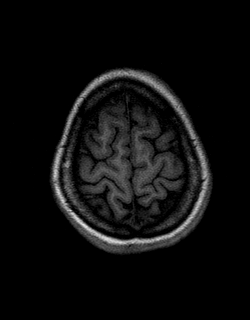
[im 144/144]
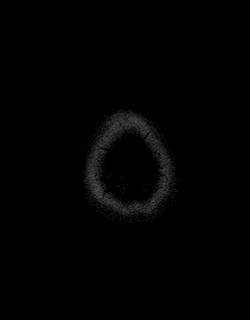

[Series 12: T2 · coronal · 5.0mm · 0.45mm/px · 2 of 27 slices shown (2 of 2)]
[im 1/27]
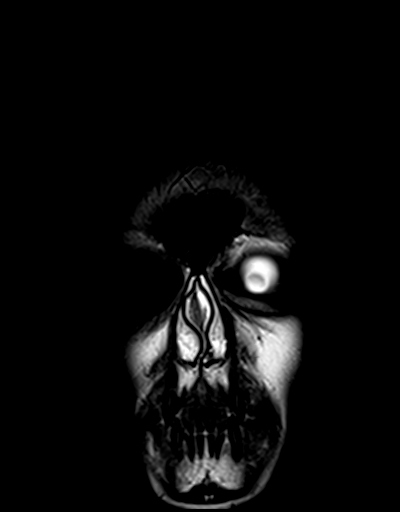
[im 27/27]
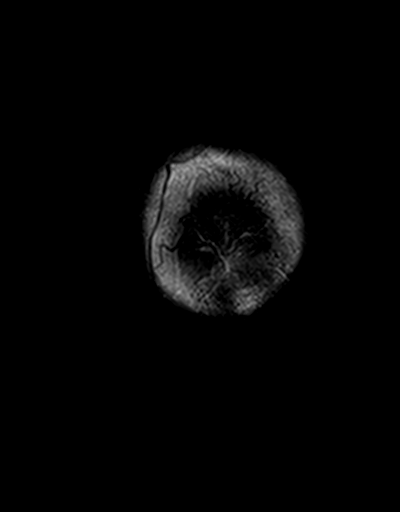

[Series 13: t1_mpr_tra · axial · 1.0mm · 0.75mm/px · z∈[-64,+82]mm · 10 of 144 slices shown (2 of 2)]
[im 1/144]
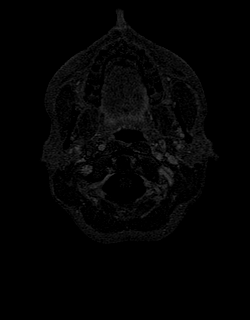
[im 16/144]
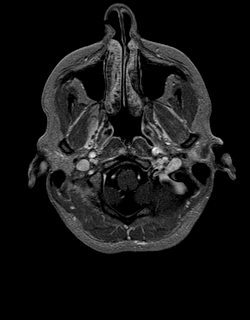
[im 32/144]
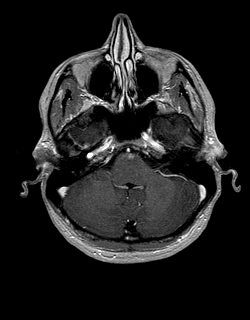
[im 48/144]
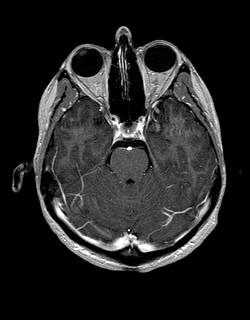
[im 64/144]
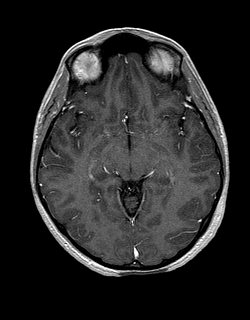
[im 80/144]
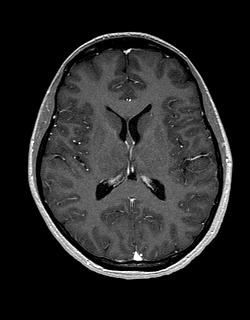
[im 96/144]
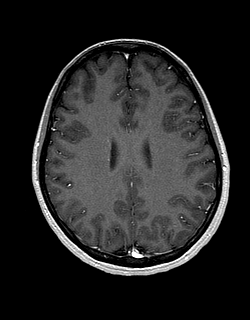
[im 112/144]
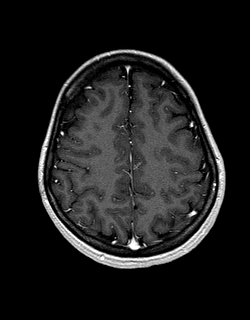
[im 128/144]
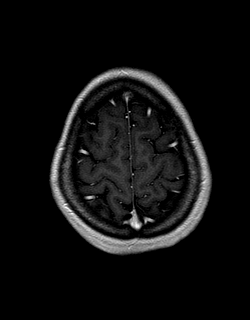
[im 144/144]
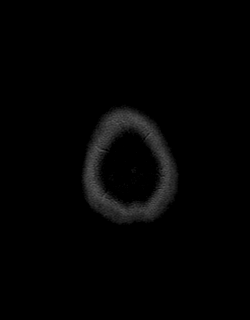

[Series 14: post cor · coronal · 5.0mm · 0.45mm/px · 2 of 27 slices shown]
[im 1/27]
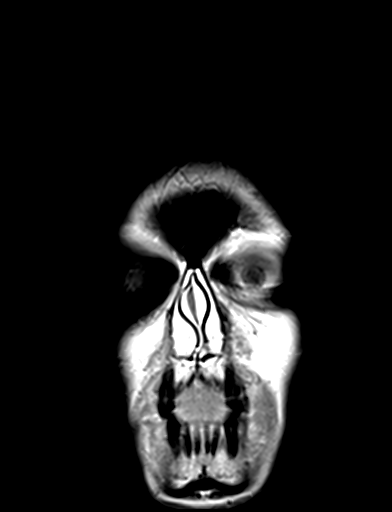
[im 27/27]
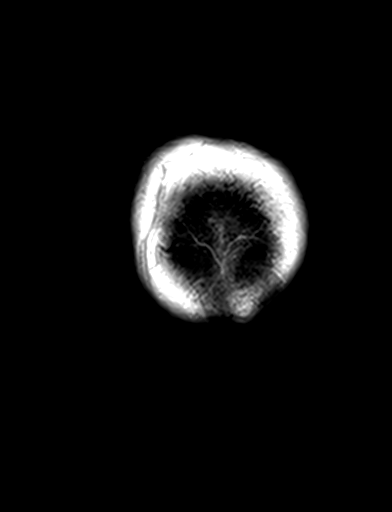

[48 of 48 positions shown; findings below may reference images not displayed]

FINDINGS: Brain: There is no acute infarction or intracranial hemorrhage.
There is no intracranial mass, mass effect, or edema. There is no
hydrocephalus or extra-axial fluid collection. No abnormal
enhancement.

Vascular: Major vessel flow voids at the skull base are preserved.

Skull and upper cervical spine: Normal marrow signal is preserved.

Sinuses/Orbits: Left maxillary sinus retention cyst. Orbits are
unremarkable.

Other: Sella is unremarkable.  Mastoid air cells are clear.
IMPRESSION: Normal MRI brain

## 2021-11-20 ENCOUNTER — Other Ambulatory Visit (HOSPITAL_BASED_OUTPATIENT_CLINIC_OR_DEPARTMENT_OTHER): Payer: Self-pay

## 2021-11-20 MED ORDER — FLUARIX QUADRIVALENT 0.5 ML IM SUSY
PREFILLED_SYRINGE | INTRAMUSCULAR | 0 refills | Status: DC
  Filled 2021-11-20: qty 0.5, 1d supply, fill #0

## 2021-12-19 ENCOUNTER — Ambulatory Visit: Payer: BC Managed Care – PPO | Admitting: Medical-Surgical

## 2022-01-31 DIAGNOSIS — R002 Palpitations: Secondary | ICD-10-CM | POA: Diagnosis not present

## 2022-02-05 ENCOUNTER — Ambulatory Visit (INDEPENDENT_AMBULATORY_CARE_PROVIDER_SITE_OTHER): Payer: BC Managed Care – PPO | Admitting: Medical-Surgical

## 2022-02-05 ENCOUNTER — Encounter: Payer: Self-pay | Admitting: Medical-Surgical

## 2022-02-05 ENCOUNTER — Ambulatory Visit (INDEPENDENT_AMBULATORY_CARE_PROVIDER_SITE_OTHER): Payer: Self-pay

## 2022-02-05 VITALS — BP 112/72 | HR 89 | Resp 20 | Ht 63.0 in | Wt 123.4 lb

## 2022-02-05 DIAGNOSIS — Z7689 Persons encountering health services in other specified circumstances: Secondary | ICD-10-CM

## 2022-02-05 DIAGNOSIS — E7801 Familial hypercholesterolemia: Secondary | ICD-10-CM | POA: Diagnosis not present

## 2022-02-05 DIAGNOSIS — R002 Palpitations: Secondary | ICD-10-CM | POA: Diagnosis not present

## 2022-02-05 LAB — BASIC METABOLIC PANEL
BUN: 12 (ref 4–21)
CO2: 25 — AB (ref 13–22)
Chloride: 104 (ref 99–108)
Creatinine: 0.8 (ref 0.5–1.1)
Glucose: 84
Potassium: 4.5 mEq/L (ref 3.5–5.1)
Sodium: 140 (ref 137–147)

## 2022-02-05 LAB — CBC AND DIFFERENTIAL
Neutrophils Absolute: 57.9
WBC: 5.3

## 2022-02-05 LAB — COMPREHENSIVE METABOLIC PANEL
Calcium: 9.8 (ref 8.7–10.7)
eGFR: 91

## 2022-02-05 LAB — CBC: RBC: 5.11 (ref 3.87–5.11)

## 2022-02-05 NOTE — Addendum Note (Signed)
Addended by: Beverlee Nims on: 02/05/2022 10:57 AM   Modules accepted: Orders

## 2022-02-05 NOTE — Progress Notes (Signed)
New Patient Office Visit  Subjective:  Patient ID: Taylor Walter, female    DOB: September 06, 1980  Age: 42 y.o. MRN: [id number]  CC:  Chief Complaint  Patient presents with   Establish Care   HPI Taylor Walter presents to establish care.  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  Hyperlipidemia: Has had several years of elevated lipid readings and is interested in recommendations for management of this.  Notes a strong family history of high cholesterol.  Has never taken cholesterol medicine follows a heart healthy low-fat diet.  Exercises regularly and maintains a healthy weight.  Palpitations: Reports that she had COVID last year in July but unfortunately tested positive again recently.  She has some lingering symptoms including sinus congestion.  Since having COVID, she has had random bouts of palpitations.  She saw cardiology for this and they recommended having some labs drawn.  She went to have the sample taken however there was a waiting for people and she was unable to wait.  Would like to have these done today.  Notes that cardiology has ordered an echocardiogram as well as a long-term monitor for further evaluation.  Past Medical History:  Diagnosis Date   Allergy    Latex   Anemia    Hx of   Scoliosis     Past Surgical History:  Procedure Laterality Date   WISDOM TOOTH EXTRACTION      Family History  Problem Relation Age of Onset   Hypertension Mother    Melanoma Father    Pancreatic cancer Maternal Grandmother    Aortic aneurysm Maternal Grandfather    Colon cancer Paternal Grandfather     Social History   Socioeconomic History   Marital status: Married    Spouse name: Not on file   Number of children: Not on file   Years of education: Not on file   Highest education level: Not on file  Occupational History   Not on file  Tobacco Use   Smoking status: Never   Smokeless tobacco: Never  Vaping Use   Vaping Use: Never  used  Substance and Sexual Activity   Alcohol use: Never   Drug use: Never   Sexual activity: Yes    Birth control/protection: I.U.D., Pill  Other Topics Concern   Not on file  Social History Narrative   Not on file   Social Determinants of Health   Financial Resource Strain: Not on file  Food Insecurity: Not on file  Transportation Needs: Not on file  Physical Activity: Not on file  Stress: Not on file  Social Connections: Not on file  Intimate Partner Violence: Not on file    ROS Review of Systems  Constitutional:  Negative for chills, fatigue, fever and unexpected weight change.  HENT:  Negative for congestion, rhinorrhea, sinus pressure and sore throat.   Respiratory:  Negative for cough, chest tightness and shortness of breath.   Cardiovascular:  Negative for chest pain, palpitations and leg swelling.  Gastrointestinal:  Negative for abdominal pain, constipation, diarrhea, nausea and vomiting.  Endocrine: Negative for cold intolerance and heat intolerance.  Genitourinary:  Negative for dysuria, frequency, urgency, vaginal bleeding and vaginal discharge.  Skin:  Negative for rash and wound.  Neurological:  Negative for dizziness, light-headedness and headaches.  Hematological:  Does not bruise/bleed easily.  Psychiatric/Behavioral:  Negative for self-injury, sleep disturbance and suicidal ideas. The patient is not nervous/anxious.     Objective:  Today's Vitals: BP 112/72 (BP Location: Right Arm, Cuff Size: Normal)   Pulse 89   Resp 20   Ht 5\' 3"  (1.6 m)   Wt 123 lb 6.4 oz (56 kg)   SpO2 98%   BMI 21.86 kg/m   Physical Exam Vitals reviewed.  Constitutional:      General: She is not in acute distress.    Appearance: Normal appearance. She is normal weight. She is not ill-appearing.  HENT:     Head: Normocephalic and atraumatic.  Cardiovascular:     Rate and Rhythm: Normal rate and regular rhythm.     Pulses: Normal pulses.     Heart sounds: Normal heart  sounds. No murmur heard.    No friction rub. No gallop.  Pulmonary:     Effort: Pulmonary effort is normal. No respiratory distress.     Breath sounds: Normal breath sounds. No wheezing.  Skin:    General: Skin is warm and dry.  Neurological:     Mental Status: She is alert and oriented to person, place, and time.  Psychiatric:        Mood and Affect: Mood normal.        Behavior: Behavior normal.        Thought Content: Thought content normal.        Judgment: Judgment normal.     Assessment & Plan:   1. Encounter to establish care Reviewed available information and discussed care concerns with patient.   2. Familial hypercholesterolemia Reviewed previous results showing elevated total and LDL cholesterol.  Unfortunately, feel that we are dealing with a genetic cause to her hyperlipidemia.  Reviewed recommendations for management with statin medications, specifically Crestor and Lipitor.  She would like to get these medications and see if she would like to get started.  Also reviewed coronary calcium scoring to help guide our decision in treatment.  She would like to proceed with this and is aware of the self-pay nature of the imaging.  Ordering today. - CT CARDIAC SCORING (SELF PAY ONLY); Future  3. Palpitations Reviewed ordered labs.  These are appropriate to check in the setting of her palpitations.  Ordering to be drawn today here in our office. - D-Dimer, Quantitative - BASIC METABOLIC PANEL WITH GFR - CBC with Differential/Platelet - B Nat Peptide   Outpatient Encounter Medications as of 02/05/2022  Medication Sig   drospirenone-ethinyl estradiol (YAZ) 3-0.02 MG tablet Take 1 tablet by mouth daily.   influenza vac split quadrivalent PF (FLUARIX QUADRIVALENT) 0.5 ML injection Inject into the muscle.   levonorgestrel (MIRENA) 20 MCG/DAY IUD 1 each by Intrauterine route once.   [DISCONTINUED] buPROPion (WELLBUTRIN XL) 150 MG 24 hr tablet Take 1 tablet (150 mg total) by mouth  daily.   No facility-administered encounter medications on file as of 02/05/2022.    Follow-up: Return for annual physical exam at your convenience.   Clearnce Sorrel, DNP, APRN, FNP-BC [facility]

## 2022-02-06 ENCOUNTER — Encounter: Payer: Self-pay | Admitting: Medical-Surgical

## 2022-02-06 ENCOUNTER — Ambulatory Visit: Payer: BC Managed Care – PPO | Admitting: Physician Assistant

## 2022-02-09 LAB — CBC WITH DIFFERENTIAL/PLATELET
Absolute Monocytes: 223 cells/uL (ref 200–950)
Basophils Absolute: 53 cells/uL (ref 0–200)
Basophils Relative: 1 %
Eosinophils Absolute: 58 cells/uL (ref 15–500)
Eosinophils Relative: 1.1 %
HCT: 43.7 % (ref 35.0–45.0)
Hemoglobin: 15 g/dL (ref 11.7–15.5)
Lymphs Abs: 1897 cells/uL (ref 850–3900)
MCH: 29.4 pg (ref 27.0–33.0)
MCHC: 34.3 g/dL (ref 32.0–36.0)
MCV: 85.5 fL (ref 80.0–100.0)
MPV: 9.3 fL (ref 7.5–12.5)
Monocytes Relative: 4.2 %
Neutro Abs: 3069 cells/uL (ref 1500–7800)
Neutrophils Relative %: 57.9 %
Platelets: 384 10*3/uL (ref 140–400)
RBC: 5.11 10*6/uL — ABNORMAL HIGH (ref 3.80–5.10)
RDW: 12.1 % (ref 11.0–15.0)
Total Lymphocyte: 35.8 %
WBC: 5.3 10*3/uL (ref 3.8–10.8)

## 2022-02-09 LAB — BASIC METABOLIC PANEL WITH GFR
BUN: 12 mg/dL (ref 7–25)
CO2: 25 mmol/L (ref 20–32)
Calcium: 9.8 mg/dL (ref 8.6–10.2)
Chloride: 104 mmol/L (ref 98–110)
Creat: 0.84 mg/dL (ref 0.50–0.97)
Glucose, Bld: 84 mg/dL (ref 65–99)
Potassium: 4.5 mmol/L (ref 3.5–5.3)
Sodium: 140 mmol/L (ref 135–146)
eGFR: 91 mL/min/{1.73_m2} (ref >=60)

## 2022-02-09 LAB — NT-PROBNP: Pro B Natriuretic peptide (BNP): <36 pg/mL (ref <125)

## 2022-02-09 LAB — D-DIMER, QUANTITATIVE: D-Dimer, Quant: 0.67 mcg/mL FEU — ABNORMAL HIGH (ref <0.50)

## 2022-02-13 ENCOUNTER — Encounter: Payer: Self-pay | Admitting: Medical-Surgical

## 2022-02-18 DIAGNOSIS — R002 Palpitations: Secondary | ICD-10-CM | POA: Diagnosis not present

## 2022-02-18 DIAGNOSIS — I359 Nonrheumatic aortic valve disorder, unspecified: Secondary | ICD-10-CM | POA: Diagnosis not present

## 2022-02-20 DIAGNOSIS — R0602 Shortness of breath: Secondary | ICD-10-CM | POA: Diagnosis not present

## 2022-02-28 DIAGNOSIS — R002 Palpitations: Secondary | ICD-10-CM | POA: Diagnosis not present

## 2022-03-14 DIAGNOSIS — I493 Ventricular premature depolarization: Secondary | ICD-10-CM | POA: Diagnosis not present

## 2022-03-14 DIAGNOSIS — R002 Palpitations: Secondary | ICD-10-CM | POA: Diagnosis not present

## 2022-03-14 DIAGNOSIS — U099 Post covid-19 condition, unspecified: Secondary | ICD-10-CM | POA: Diagnosis not present

## 2022-03-14 DIAGNOSIS — I4711 Inappropriate sinus tachycardia, so stated: Secondary | ICD-10-CM | POA: Diagnosis not present

## 2022-08-21 ENCOUNTER — Telehealth: Payer: Self-pay | Admitting: *Deleted

## 2022-08-21 NOTE — Telephone Encounter (Signed)
Left patient a message to call and schedule annual. 

## 2022-08-29 ENCOUNTER — Encounter: Payer: Self-pay | Admitting: Medical-Surgical

## 2022-09-09 ENCOUNTER — Encounter: Payer: Self-pay | Admitting: Medical-Surgical

## 2022-09-09 ENCOUNTER — Telehealth (INDEPENDENT_AMBULATORY_CARE_PROVIDER_SITE_OTHER): Payer: Managed Care, Other (non HMO) | Admitting: Medical-Surgical

## 2022-09-09 DIAGNOSIS — L659 Nonscarring hair loss, unspecified: Secondary | ICD-10-CM | POA: Diagnosis not present

## 2022-09-09 DIAGNOSIS — R5383 Other fatigue: Secondary | ICD-10-CM | POA: Diagnosis not present

## 2022-09-09 DIAGNOSIS — G43909 Migraine, unspecified, not intractable, without status migrainosus: Secondary | ICD-10-CM

## 2022-09-09 DIAGNOSIS — H9319 Tinnitus, unspecified ear: Secondary | ICD-10-CM | POA: Diagnosis not present

## 2022-09-09 DIAGNOSIS — R4189 Other symptoms and signs involving cognitive functions and awareness: Secondary | ICD-10-CM

## 2022-09-09 NOTE — Progress Notes (Signed)
Virtual Visit via Video Note  I connected with Taylor Walter on 09/09/22 at  3:20 PM EDT by a video enabled telemedicine application and verified that I am speaking with the correct person using two identifiers.   I discussed the limitations of evaluation and management by telemedicine and the availability of in person appointments. The patient expressed understanding and agreed to proceed.  Patient location: home Provider locations: office  Subjective:    CC: post covid concerns  HPI: Very pleasant 42 year old female presenting via MyChart video visit to discuss long-term post-COVID symptoms.  She had COVID last summer and then again in January.  Since then, she has had concerning fatigue that requires increased rest/napping and has left her intolerant to increased activity.  Notes that she is able to exercise and function normally however if she puts effort into her exercise routine, she will be nearly debilitated the next day.  Brain fog has also been an issue where she feels her thoughts are not as clear or quick as they used to be.  Some days are good and others are worse.  Has had new onset migraines as well as tinnitus since experiencing COVID.  She has tried multiple avenues of self treatment including adjusting her diet, changing her mealtimes, getting a new mattress Topper to aid with better sleep, and starting multivitamins.  Notes that she does have trouble keeping hydrated and feels very dry unless she is drinking 3 to 440 ounce bottles of water daily.  Has also noticed increased hair loss although some of this is starting to grow back.   Past medical history, Surgical history, Family history not pertinant except as noted below, Social history, Allergies, and medications have been entered into the medical record, reviewed, and corrections made.   Review of Systems: See HPI for pertinent positives and negatives.   Objective:    General: Speaking clearly in complete sentences  without any shortness of breath.  Alert and oriented x3.  Normal judgment. No apparent acute distress.  Impression and Recommendations:    1. Fatigue, unspecified type Unclear etiology for her symptoms.  Consider possible long COVID however difficult to determine.  Plan for comprehensive labs as below.  Referring to neurology for further evaluation and recommendations. - Ambulatory referral to Neurology - CBC with Differential/Platelet - CMP14+EGFR - TSH - Hemoglobin A1c - Vitamin B12 - Iron, TIBC and Ferritin Panel - VITAMIN D 25 Hydroxy (Vit-D Deficiency, Fractures) - D-Dimer, Quantitative  2. Brain fog Referring to neurology.  Checking labs as below. - Ambulatory referral to Neurology - ANA - Sed Rate (ESR) - C-reactive protein - D-Dimer, Quantitative  3. Hair loss Checking labs as below. - TSH - Hemoglobin A1c - Vitamin B12 - VITAMIN D 25 Hydroxy (Vit-D Deficiency, Fractures)  4. Tinnitus, unspecified laterality 5. Migraine without status migrainosus, not intractable, unspecified migraine type Referring to neurology for further evaluation. - Ambulatory referral to Neurology   I discussed the assessment and treatment plan with the patient. The patient was provided an opportunity to ask questions and all were answered. The patient agreed with the plan and demonstrated an understanding of the instructions.   The patient was advised to call back or seek an in-person evaluation if the symptoms worsen or if the condition fails to improve as anticipated.  25 minutes of non-face-to-face time was provided during this encounter.  Return if symptoms worsen or fail to improve.  Thayer Ohm, DNP, APRN, FNP-BC [facility]

## 2022-10-23 ENCOUNTER — Encounter: Payer: Self-pay | Admitting: Medical-Surgical

## 2022-10-23 DIAGNOSIS — Z1231 Encounter for screening mammogram for malignant neoplasm of breast: Secondary | ICD-10-CM

## 2022-11-27 ENCOUNTER — Ambulatory Visit: Payer: Managed Care, Other (non HMO)

## 2022-11-27 DIAGNOSIS — Z1231 Encounter for screening mammogram for malignant neoplasm of breast: Secondary | ICD-10-CM | POA: Diagnosis not present

## 2023-02-10 ENCOUNTER — Encounter: Payer: BC Managed Care – PPO | Admitting: Medical-Surgical

## 2023-02-21 ENCOUNTER — Encounter: Payer: BC Managed Care – PPO | Admitting: Medical-Surgical

## 2023-03-18 ENCOUNTER — Ambulatory Visit (INDEPENDENT_AMBULATORY_CARE_PROVIDER_SITE_OTHER): Payer: Managed Care, Other (non HMO) | Admitting: Medical-Surgical

## 2023-03-18 ENCOUNTER — Encounter: Payer: Self-pay | Admitting: Medical-Surgical

## 2023-03-18 VITALS — BP 111/74 | HR 97 | Resp 20 | Ht 63.0 in | Wt 133.0 lb

## 2023-03-18 DIAGNOSIS — E7801 Familial hypercholesterolemia: Secondary | ICD-10-CM

## 2023-03-18 DIAGNOSIS — Z0000 Encounter for general adult medical examination without abnormal findings: Secondary | ICD-10-CM | POA: Diagnosis not present

## 2023-03-18 DIAGNOSIS — R1032 Left lower quadrant pain: Secondary | ICD-10-CM

## 2023-03-18 NOTE — Patient Instructions (Signed)
 Preventive Care 16-41 Years Old, Female  Preventive care refers to lifestyle choices and visits with your health care provider that can promote health and wellness. Preventive care visits are also called wellness exams.  What can I expect for my preventive care visit?  Counseling  Your health care provider may ask you questions about your:  Medical history, including:  Past medical problems.  Family medical history.  Pregnancy history.  Current health, including:  Menstrual cycle.  Method of birth control.  Emotional well-being.  Home life and relationship well-being.  Sexual activity and sexual health.  Lifestyle, including:  Alcohol, nicotine or tobacco, and drug use.  Access to firearms.  Diet, exercise, and sleep habits.  Work and work Astronomer.  Sunscreen use.  Safety issues such as seatbelt and bike helmet use.  Physical exam  Your health care provider will check your:  Height and weight. These may be used to calculate your BMI (body mass index). BMI is a measurement that tells if you are at a healthy weight.  Waist circumference. This measures the distance around your waistline. This measurement also tells if you are at a healthy weight and may help predict your risk of certain diseases, such as type 2 diabetes and high blood pressure.  Heart rate and blood pressure.  Body temperature.  Skin for abnormal spots.  What immunizations do I need?    Vaccines are usually given at various ages, according to a schedule. Your health care provider will recommend vaccines for you based on your age, medical history, and lifestyle or other factors, such as travel or where you work.  What tests do I need?  Screening  Your health care provider may recommend screening tests for certain conditions. This may include:  Lipid and cholesterol levels.  Diabetes screening. This is done by checking your blood sugar (glucose) after you have not eaten for a while (fasting).  Pelvic exam and Pap test.  Hepatitis B test.  Hepatitis C  test.  HIV (human immunodeficiency virus) test.  STI (sexually transmitted infection) testing, if you are at risk.  Lung cancer screening.  Colorectal cancer screening.  Mammogram. Talk with your health care provider about when you should start having regular mammograms. This may depend on whether you have a family history of breast cancer.  BRCA-related cancer screening. This may be done if you have a family history of breast, ovarian, tubal, or peritoneal cancers.  Bone density scan. This is done to screen for osteoporosis.  Talk with your health care provider about your test results, treatment options, and if necessary, the need for more tests.  Follow these instructions at home:  Eating and drinking    Eat a diet that includes fresh fruits and vegetables, whole grains, lean protein, and low-fat dairy products.  Take vitamin and mineral supplements as recommended by your health care provider.  Do not drink alcohol if:  Your health care provider tells you not to drink.  You are pregnant, may be pregnant, or are planning to become pregnant.  If you drink alcohol:  Limit how much you have to 0-1 drink a day.  Know how much alcohol is in your drink. In the [place] one drink equals one 12 oz bottle of beer (355 mL), one 5 oz glass of wine (148 mL), or one 1 oz glass of hard liquor (44 mL).  Lifestyle  Brush your teeth every morning and night with fluoride toothpaste. Floss one time each day.  Exercise for at least  30 minutes 5 or more days each week.  Do not use any products that contain nicotine or tobacco. These products include cigarettes, chewing tobacco, and vaping devices, such as e-cigarettes. If you need help quitting, ask your health care provider.  Do not use drugs.  If you are sexually active, practice safe sex. Use a condom or other form of protection to prevent STIs.  If you do not wish to become pregnant, use a form of birth control. If you plan to become pregnant, see your health care provider for a  prepregnancy visit.  Take aspirin only as told by your health care provider. Make sure that you understand how much to take and what form to take. Work with your health care provider to find out whether it is safe and beneficial for you to take aspirin daily.  Find healthy ways to manage stress, such as:  Meditation, yoga, or listening to music.  Journaling.  Talking to a trusted person.  Spending time with friends and family.  Minimize exposure to UV radiation to reduce your risk of skin cancer.  Safety  Always wear your seat belt while driving or riding in a vehicle.  Do not drive:  If you have been drinking alcohol. Do not ride with someone who has been drinking.  When you are tired or distracted.  While texting.  If you have been using any mind-altering substances or drugs.  Wear a helmet and other protective equipment during sports activities.  If you have firearms in your house, make sure you follow all gun safety procedures.  Seek help if you have been physically or sexually abused.  What's next?  Visit your health care provider once a year for an annual wellness visit.  Ask your health care provider how often you should have your eyes and teeth checked.  Stay up to date on all vaccines.  This information is not intended to replace advice given to you by your health care provider. Make sure you discuss any questions you have with your health care provider.  Document Revised: 07/05/2020 Document Reviewed: 07/05/2020  Elsevier Patient Education  2024 [employer].

## 2023-03-18 NOTE — Progress Notes (Signed)
 Complete physical exam  Patient: Taylor Walter   DOB: 30-Mar-1980   43 y.o. Female  MRN: [id number]  Subjective:    Chief Complaint  Patient presents with  . Annual Exam    Taylor Walter is a 43 y.o. female who presents today for a complete physical exam. She reports consuming a  ovopecastarian  diet.  Walking 10k steps per day, intermittent weight lifting.  She generally feels well. She reports sleeping well. She does not have additional problems to discuss today.    Most recent fall risk assessment:    09/09/2022    3:18 PM  Fall Risk   Falls in the past year? 0  Number falls in past yr: 0  Injury with Fall? 0  Risk for fall due to : No Fall Risks  Follow up Falls evaluation completed     Most recent depression screenings:    03/18/2023    3:50 PM 09/09/2022    3:19 PM  PHQ 2/9 Scores  PHQ - 2 Score 0 0   Vision:Within last year and Dental: No current dental problems and Receives regular dental care    Patient Care Team: Christen Butter, NP as PCP - General (Nurse Practitioner)   Outpatient Medications Prior to Visit  Medication Sig  . levonorgestrel (MIRENA) 20 MCG/DAY IUD 1 each by Intrauterine route once.  . [DISCONTINUED] drospirenone-ethinyl estradiol (YAZ) 3-0.02 MG tablet Take 1 tablet by mouth daily.   No facility-administered medications prior to visit.    Review of Systems  Constitutional:  Negative for chills, fever, malaise/fatigue and weight loss.  HENT:  Negative for congestion, ear pain, hearing loss, sinus pain and sore throat.   Eyes:  Negative for blurred vision, photophobia and pain.  Respiratory:  Negative for cough, shortness of breath and wheezing.   Cardiovascular:  Negative for chest pain, palpitations and leg swelling.  Gastrointestinal:  Negative for abdominal pain, constipation, diarrhea, heartburn, nausea and vomiting.  Genitourinary:  Negative for dysuria, frequency and urgency.  Musculoskeletal:  Negative for falls and  neck pain.  Skin:  Negative for itching and rash.  Neurological:  Negative for dizziness, weakness and headaches.  Endo/Heme/Allergies:  Negative for polydipsia. Does not bruise/bleed easily.  Psychiatric/Behavioral:  Negative for depression, substance abuse and suicidal ideas. The patient is not nervous/anxious.           Objective:     BP 111/74 (BP Location: Left Arm, Cuff Size: Normal)   Pulse 97   Resp 20   Ht 5\' 3"  (1.6 m)   Wt 133 lb (60.3 kg)   SpO2 98%   BMI 23.56 kg/m    Physical Exam Vitals reviewed.  Constitutional:      General: She is not in acute distress.    Appearance: Normal appearance. She is normal weight. She is not ill-appearing.  HENT:     Head: Normocephalic and atraumatic.     Right Ear: Tympanic membrane, ear canal and external ear normal. There is no impacted cerumen.     Left Ear: Tympanic membrane, ear canal and external ear normal. There is no impacted cerumen.     Nose: Nose normal. No congestion or rhinorrhea.     Mouth/Throat:     Mouth: Mucous membranes are moist.     Pharynx: No oropharyngeal exudate or posterior oropharyngeal erythema.  Eyes:     General: No scleral icterus.       Right eye: No discharge.  Left eye: No discharge.     Extraocular Movements: Extraocular movements intact.     Conjunctiva/sclera: Conjunctivae normal.     Pupils: Pupils are equal, round, and reactive to light.  Neck:     Thyroid: No thyromegaly.     Vascular: No carotid bruit or JVD.     Trachea: Trachea normal.  Cardiovascular:     Rate and Rhythm: Normal rate and regular rhythm.     Pulses: Normal pulses.     Heart sounds: Normal heart sounds. No murmur heard.    No friction rub. No gallop.  Pulmonary:     Effort: Pulmonary effort is normal. No respiratory distress.     Breath sounds: Normal breath sounds. No wheezing.  Abdominal:     General: Bowel sounds are normal. There is no distension.     Palpations: Abdomen is soft.      Tenderness: There is abdominal tenderness in the left lower quadrant. There is no guarding.    Musculoskeletal:        General: Normal range of motion.     Cervical back: Normal range of motion and neck supple.  Lymphadenopathy:     Cervical: No cervical adenopathy.  Skin:    General: Skin is warm and dry.  Neurological:     Mental Status: She is alert and oriented to person, place, and time.     Cranial Nerves: No cranial nerve deficit.  Psychiatric:        Mood and Affect: Mood normal.        Behavior: Behavior normal.        Thought Content: Thought content normal.        Judgment: Judgment normal.  No results found for any visits on 03/18/23.     Assessment & Plan:    Routine Health Maintenance and Physical Exam  Immunization History  Administered Date(s) Administered  . Influenza,inj,Quad PF,6+ Mos 11/27/2020, 11/20/2021  . Janssen (J&J) SARS-COV-2 Vaccination 05/01/2019  . Moderna Covid-19 Vaccine Bivalent Booster 15yrs & up 10/23/2020  . Tdap 05/29/2016    Health Maintenance  Topic Date Due  . Cervical Cancer Screening (HPV/Pap Cotest)  Never done  . COVID-19 Vaccine (3 - 2024-25 season) 04/03/2023 (Originally 09/22/2022)  . INFLUENZA VACCINE  04/21/2023 (Originally 08/22/2022)  . DTaP/Tdap/Td (2 - Td or Tdap) 05/30/2026  . Hepatitis C Screening  Completed  . HPV VACCINES  Aged Out  . HIV Screening  Discontinued    Discussed health benefits of physical activity, and encouraged her to engage in regular exercise appropriate for her age and condition.  1. Annual physical exam (Primary) Checking labs as below. UTD on preventative care. Wellness information provided with AVS. - CBC with Differential/Platelet - CMP14+EGFR - Lipid panel  2. Familial hypercholesterolemia Checking labs.  - CMP14+EGFR - Lipid panel  3. Left lower quadrant abdominal pain Unclear etiology. Discomfort located at the low edge of the LLQ into the groin but no palpable hernia. Discussed  pelvic US but feel this would not be informative given the location. Ordering CT abdomen/pelvis for further evaluation.  - CT ABDOMEN PELVIS WO CONTRAST; Future   Return in about 1 year (around 03/17/2024) for annual physical exam or sooner if needed.     Christen Butter, NP

## 2023-03-19 ENCOUNTER — Encounter: Payer: Self-pay | Admitting: Medical-Surgical

## 2023-03-19 LAB — LIPID PANEL
Chol/HDL Ratio: 3 ratio (ref 0.0–4.4)
Cholesterol, Total: 192 mg/dL (ref 100–199)
HDL: 64 mg/dL (ref >39)
LDL Chol Calc (NIH): 110 mg/dL — ABNORMAL HIGH (ref 0–99)
Triglycerides: 98 mg/dL (ref 0–149)
VLDL Cholesterol Cal: 18 mg/dL (ref 5–40)

## 2023-03-19 LAB — CMP14+EGFR
ALT: 45 IU/L — ABNORMAL HIGH (ref 0–32)
AST: 31 IU/L (ref 0–40)
Albumin: 4.2 g/dL (ref 3.9–4.9)
Alkaline Phosphatase: 49 IU/L (ref 44–121)
BUN/Creatinine Ratio: 30 — ABNORMAL HIGH (ref 9–23)
BUN: 21 mg/dL (ref 6–24)
Bilirubin Total: 0.5 mg/dL (ref 0.0–1.2)
CO2: 21 mmol/L (ref 20–29)
Calcium: 8.8 mg/dL (ref 8.7–10.2)
Chloride: 106 mmol/L (ref 96–106)
Creatinine, Ser: 0.69 mg/dL (ref 0.57–1.00)
Globulin, Total: 2.4 g/dL (ref 1.5–4.5)
Glucose: 79 mg/dL (ref 70–99)
Potassium: 4.2 mmol/L (ref 3.5–5.2)
Sodium: 141 mmol/L (ref 134–144)
Total Protein: 6.6 g/dL (ref 6.0–8.5)
eGFR: 112 mL/min/{1.73_m2} (ref >59)

## 2023-03-19 LAB — CBC WITH DIFFERENTIAL/PLATELET
Basophils Absolute: 0 10*3/uL (ref 0.0–0.2)
Basos: 1 %
EOS (ABSOLUTE): 0.1 10*3/uL (ref 0.0–0.4)
Eos: 1 %
Hematocrit: 41.3 % (ref 34.0–46.6)
Hemoglobin: 14 g/dL (ref 11.1–15.9)
Immature Grans (Abs): 0 10*3/uL (ref 0.0–0.1)
Immature Granulocytes: 0 %
Lymphocytes Absolute: 2.9 10*3/uL (ref 0.7–3.1)
Lymphs: 38 %
MCH: 29 pg (ref 26.6–33.0)
MCHC: 33.9 g/dL (ref 31.5–35.7)
MCV: 86 fL (ref 79–97)
Monocytes Absolute: 0.4 10*3/uL (ref 0.1–0.9)
Monocytes: 5 %
Neutrophils Absolute: 4.2 10*3/uL (ref 1.4–7.0)
Neutrophils: 55 %
Platelets: 307 10*3/uL (ref 150–450)
RBC: 4.82 x10E6/uL (ref 3.77–5.28)
RDW: 12.8 % (ref 11.7–15.4)
WBC: 7.6 10*3/uL (ref 3.4–10.8)

## 2023-03-19 LAB — SPECIMEN STATUS REPORT

## 2023-04-16 ENCOUNTER — Ambulatory Visit

## 2023-04-16 DIAGNOSIS — R1032 Left lower quadrant pain: Secondary | ICD-10-CM

## 2023-04-21 ENCOUNTER — Encounter: Payer: Self-pay | Admitting: Medical-Surgical

## 2023-04-21 DIAGNOSIS — K7689 Other specified diseases of liver: Secondary | ICD-10-CM

## 2023-04-21 DIAGNOSIS — D219 Benign neoplasm of connective and other soft tissue, unspecified: Secondary | ICD-10-CM

## 2023-05-27 ENCOUNTER — Encounter: Payer: Self-pay | Admitting: Medical-Surgical

## 2023-06-09 ENCOUNTER — Encounter: Payer: Self-pay | Admitting: Medical-Surgical

## 2023-06-11 ENCOUNTER — Ambulatory Visit (INDEPENDENT_AMBULATORY_CARE_PROVIDER_SITE_OTHER)

## 2023-06-11 DIAGNOSIS — D219 Benign neoplasm of connective and other soft tissue, unspecified: Secondary | ICD-10-CM

## 2023-06-11 DIAGNOSIS — K7689 Other specified diseases of liver: Secondary | ICD-10-CM | POA: Diagnosis not present

## 2023-06-11 DIAGNOSIS — D259 Leiomyoma of uterus, unspecified: Secondary | ICD-10-CM

## 2023-06-12 ENCOUNTER — Ambulatory Visit: Payer: Self-pay | Admitting: Medical-Surgical

## 2023-10-15 ENCOUNTER — Encounter: Payer: Self-pay | Admitting: Medical-Surgical

## 2023-10-15 DIAGNOSIS — Z1231 Encounter for screening mammogram for malignant neoplasm of breast: Secondary | ICD-10-CM

## 2023-12-01 ENCOUNTER — Ambulatory Visit (HOSPITAL_BASED_OUTPATIENT_CLINIC_OR_DEPARTMENT_OTHER)

## 2023-12-04 ENCOUNTER — Encounter (HOSPITAL_BASED_OUTPATIENT_CLINIC_OR_DEPARTMENT_OTHER): Payer: Self-pay

## 2023-12-04 ENCOUNTER — Ambulatory Visit (HOSPITAL_BASED_OUTPATIENT_CLINIC_OR_DEPARTMENT_OTHER)
Admission: RE | Admit: 2023-12-04 | Discharge: 2023-12-04 | Disposition: A | Discharge status: Home / Self Care | Source: Ambulatory Visit | Attending: Medical-Surgical | Admitting: Medical-Surgical

## 2023-12-04 DIAGNOSIS — Z1231 Encounter for screening mammogram for malignant neoplasm of breast: Secondary | ICD-10-CM | POA: Diagnosis present

## 2023-12-08 ENCOUNTER — Ambulatory Visit: Payer: Self-pay | Admitting: Medical-Surgical

## 2024-02-10 ENCOUNTER — Encounter: Payer: Self-pay | Admitting: Medical-Surgical

## 2024-02-10 ENCOUNTER — Ambulatory Visit (INDEPENDENT_AMBULATORY_CARE_PROVIDER_SITE_OTHER): Admitting: Medical-Surgical

## 2024-02-10 VITALS — BP 117/81 | HR 84 | Temp 99.5°F | Resp 16 | Ht 63.0 in | Wt 134.1 lb

## 2024-02-10 DIAGNOSIS — Z0000 Encounter for general adult medical examination without abnormal findings: Secondary | ICD-10-CM | POA: Diagnosis not present

## 2024-02-10 DIAGNOSIS — E78019 Familial hypercholesterolemia, unspecified: Secondary | ICD-10-CM | POA: Diagnosis not present

## 2024-02-10 NOTE — Progress Notes (Signed)
 "  Complete physical exam  Patient: Taylor Walter   DOB: [birth year]   44 y.o. Female  MRN: [id number]  Subjective:    Chief Complaint  Patient presents with   Annual Exam   Taylor Walter is a 44 y.o. female who presents today for a complete physical exam. She reports consuming a general diet. Gets 10k steps daily, 4 times weekly doing weight training.  She generally feels well. She reports sleeping well. She does have additional problems to discuss today.    Most recent fall risk assessment:    02/10/2024   10:52 AM  Fall Risk   Falls in the past year? 0  Number falls in past yr: 0  Injury with Fall? 0  Risk for fall due to : No Fall Risks  Follow up Falls evaluation completed     Most recent depression screenings:    02/10/2024   10:53 AM 03/18/2023    3:50 PM  PHQ 2/9 Scores  PHQ - 2 Score 0 0  PHQ- 9 Score 0     Vision:Within last year and Dental: No current dental problems and UTD on dental care    Patient Care Team: Willo Mini, NP as PCP - General (Nurse Practitioner)   Show/hide medication list[1]  Review of Systems  Constitutional:  Negative for chills, fever, malaise/fatigue and weight loss.  HENT:  Negative for congestion, ear pain, hearing loss, sinus pain and sore throat.   Eyes:  Negative for blurred vision, photophobia and pain.  Respiratory:  Negative for cough, shortness of breath and wheezing.   Cardiovascular:  Negative for chest pain, palpitations and leg swelling.  Gastrointestinal:  Negative for abdominal pain, constipation, diarrhea, heartburn, nausea and vomiting.  Genitourinary:  Negative for dysuria, frequency and urgency.  Musculoskeletal:  Negative for falls and neck pain.  Skin:  Negative for itching and rash.  Neurological:  Negative for dizziness, weakness and headaches.  Endo/Heme/Allergies:  Negative for polydipsia. Does not bruise/bleed easily.  Psychiatric/Behavioral:  Negative for depression, substance abuse and  suicidal ideas. The patient is not nervous/anxious and does not have insomnia.      Objective:     BP 117/81   Pulse 84   Temp 99.5 F (37.5 C) (Oral)   Resp 16   Ht 5' 3 (1.6 m)   Wt 134 lb 1.3 oz (60.8 kg)   SpO2 100%   BMI 23.75 kg/m    Physical Exam Constitutional:      General: She is not in acute distress.    Appearance: Normal appearance. She is not ill-appearing.  HENT:     Head: Normocephalic and atraumatic.     Right Ear: Tympanic membrane, ear canal and external ear normal. There is no impacted cerumen.     Left Ear: Tympanic membrane, ear canal and external ear normal. There is no impacted cerumen.     Nose: Nose normal. No congestion or rhinorrhea.     Mouth/Throat:     Mouth: Mucous membranes are moist.     Pharynx: No oropharyngeal exudate or posterior oropharyngeal erythema.  Eyes:     General: No scleral icterus.       Right eye: No discharge.        Left eye: No discharge.     Extraocular Movements: Extraocular movements intact.     Conjunctiva/sclera: Conjunctivae normal.     Pupils: Pupils are equal, round, and reactive to light.  Neck:     Thyroid: No thyromegaly.  Vascular: No carotid bruit or JVD.     Trachea: Trachea normal.  Cardiovascular:     Rate and Rhythm: Normal rate and regular rhythm.     Pulses: Normal pulses.     Heart sounds: Normal heart sounds. No murmur heard.    No friction rub. No gallop.  Pulmonary:     Effort: Pulmonary effort is normal. No respiratory distress.     Breath sounds: Normal breath sounds. No wheezing.  Abdominal:     General: Bowel sounds are normal. There is no distension.     Palpations: Abdomen is soft.     Tenderness: There is no abdominal tenderness. There is no guarding.  Musculoskeletal:        General: Normal range of motion.     Cervical back: Normal range of motion and neck supple.  Lymphadenopathy:     Cervical: No cervical adenopathy.  Skin:    General: Skin is warm and dry.   Neurological:     Mental Status: She is alert and oriented to person, place, and time.     Cranial Nerves: No cranial nerve deficit.  Psychiatric:        Mood and Affect: Mood normal.        Behavior: Behavior normal.        Thought Content: Thought content normal.        Judgment: Judgment normal.    No results found for any visits on 02/10/24.     Assessment & Plan:    Routine Health Maintenance and Physical Exam  Immunization History  Administered Date(s) Administered   Influenza,inj,Quad PF,6+ Mos 11/27/2020, 11/20/2021   Janssen (J&J) SARS-COV-2 Vaccination 05/01/2019   Moderna Covid-19 Vaccine Bivalent Booster 56yrs & up 10/23/2020   Tdap 05/29/2016    Health Maintenance  Topic Date Due   Hepatitis B Vaccines 19-59 Average Risk (1 of 3 - 19+ 3-dose series) Never done   Cervical Cancer Screening (HPV/Pap Cotest)  Never done   COVID-19 Vaccine (3 - 2025-26 season) 02/26/2024 (Originally 09/22/2023)   Influenza Vaccine  04/20/2024 (Originally 08/22/2023)   Mammogram  12/03/2025   DTaP/Tdap/Td (2 - Td or Tdap) 05/30/2026   HPV VACCINES (No Doses Required) Completed   Hepatitis C Screening  Completed   Pneumococcal Vaccine  Aged Out   Meningococcal B Vaccine  Aged Out   HIV Screening  Discontinued    Discussed health benefits of physical activity, and encouraged her to engage in regular exercise appropriate for her age and condition.  1. Annual physical exam (Primary) Checking labs as below. UTD on preventative care. Wellness information provided with AVS. - CBC with Differential/Platelet - Comprehensive metabolic panel with GFR  2. Familial hypercholesterolemia, unspecified type Updating labs today.  - Comprehensive metabolic panel with GFR - Lipid panel  Return in about 1 year (around 02/09/2025) for annual physical exam.   Zada Palin, NP      [1]  Outpatient Medications Prior to Visit  Medication Sig   levonorgestrel (MIRENA) 20 MCG/DAY IUD 1 each by  Intrauterine route once.   SLYND 4 MG TABS Take 4 mg by mouth.   No facility-administered medications prior to visit.   "

## 2024-02-10 NOTE — Patient Instructions (Signed)
 Preventive Care 58-42 Years Old, Female  Preventive care refers to lifestyle choices and visits with your health care provider that can promote health and wellness. Preventive care visits are also called wellness exams.  What can I expect for my preventive care visit?  Counseling  Your health care provider may ask you questions about your:  Medical history, including:  Past medical problems.  Family medical history.  Pregnancy history.  Current health, including:  Menstrual cycle.  Method of birth control.  Emotional well-being.  Home life and relationship well-being.  Sexual activity and sexual health.  Lifestyle, including:  Alcohol, nicotine or tobacco, and drug use.  Access to firearms.  Diet, exercise, and sleep habits.  Work and work Astronomer.  Sunscreen use.  Safety issues such as seatbelt and bike helmet use.  Physical exam  Your health care provider will check your:  Height and weight. These may be used to calculate your BMI (body mass index). BMI is a measurement that tells if you are at a healthy weight.  Waist circumference. This measures the distance around your waistline. This measurement also tells if you are at a healthy weight and may help predict your risk of certain diseases, such as type 2 diabetes and high blood pressure.  Heart rate and blood pressure.  Body temperature.  Skin for abnormal spots.  What immunizations do I need?    Vaccines are usually given at various ages, according to a schedule. Your health care provider will recommend vaccines for you based on your age, medical history, and lifestyle or other factors, such as travel or where you work.  What tests do I need?  Screening  Your health care provider may recommend screening tests for certain conditions. This may include:  Lipid and cholesterol levels.  Diabetes screening. This is done by checking your blood sugar (glucose) after you have not eaten for a while (fasting).  Pelvic exam and Pap test.  Hepatitis B test.  Hepatitis C  test.  HIV (human immunodeficiency virus) test.  STI (sexually transmitted infection) testing, if you are at risk.  Lung cancer screening.  Colorectal cancer screening.  Mammogram. Talk with your health care provider about when you should start having regular mammograms. This may depend on whether you have a family history of breast cancer.  BRCA-related cancer screening. This may be done if you have a family history of breast, ovarian, tubal, or peritoneal cancers.  Bone density scan. This is done to screen for osteoporosis.  Talk with your health care provider about your test results, treatment options, and if necessary, the need for more tests.  Follow these instructions at home:  Eating and drinking    Eat a diet that includes fresh fruits and vegetables, whole grains, lean protein, and low-fat dairy products.  Take vitamin and mineral supplements as recommended by your health care provider.  Do not drink alcohol if:  Your health care provider tells you not to drink.  You are pregnant, may be pregnant, or are planning to become pregnant.  If you drink alcohol:  Limit how much you have to 0-1 drink a day.  Know how much alcohol is in your drink. In the [place] one drink equals one 12 oz bottle of beer (355 mL), one 5 oz glass of wine (148 mL), or one 1 oz glass of hard liquor (44 mL).  Lifestyle  Brush your teeth every morning and night with fluoride toothpaste. Floss one time each day.  Exercise for at least  30 minutes 5 or more days each week.  Do not use any products that contain nicotine or tobacco. These products include cigarettes, chewing tobacco, and vaping devices, such as e-cigarettes. If you need help quitting, ask your health care provider.  Do not use drugs.  If you are sexually active, practice safe sex. Use a condom or other form of protection to prevent STIs.  If you do not wish to become pregnant, use a form of birth control. If you plan to become pregnant, see your health care provider for a  prepregnancy visit.  Take aspirin only as told by your health care provider. Make sure that you understand how much to take and what form to take. Work with your health care provider to find out whether it is safe and beneficial for you to take aspirin daily.  Find healthy ways to manage stress, such as:  Meditation, yoga, or listening to music.  Journaling.  Talking to a trusted person.  Spending time with friends and family.  Minimize exposure to UV radiation to reduce your risk of skin cancer.  Safety  Always wear your seat belt while driving or riding in a vehicle.  Do not drive:  If you have been drinking alcohol. Do not ride with someone who has been drinking.  When you are tired or distracted.  While texting.  If you have been using any mind-altering substances or drugs.  Wear a helmet and other protective equipment during sports activities.  If you have firearms in your house, make sure you follow all gun safety procedures.  Seek help if you have been physically or sexually abused.  What's next?  Visit your health care provider once a year for an annual wellness visit.  Ask your health care provider how often you should have your eyes and teeth checked.  Stay up to date on all vaccines.  This information is not intended to replace advice given to you by your health care provider. Make sure you discuss any questions you have with your health care provider.  Document Revised: 07/05/2020 Document Reviewed: 07/05/2020  Elsevier Patient Education  2024 [employer].

## 2024-02-11 ENCOUNTER — Ambulatory Visit: Payer: Self-pay | Admitting: Medical-Surgical

## 2024-02-11 DIAGNOSIS — R748 Abnormal levels of other serum enzymes: Secondary | ICD-10-CM

## 2024-02-11 LAB — CBC WITH DIFFERENTIAL/PLATELET
Basophils Absolute: 0.1 x10E3/uL (ref 0.0–0.2)
Basos: 2 %
EOS (ABSOLUTE): 0.1 x10E3/uL (ref 0.0–0.4)
Eos: 1 %
Hematocrit: 41.4 % (ref 34.0–46.6)
Hemoglobin: 13.8 g/dL (ref 11.1–15.9)
Immature Grans (Abs): 0 x10E3/uL (ref 0.0–0.1)
Immature Granulocytes: 0 %
Lymphocytes Absolute: 2.1 x10E3/uL (ref 0.7–3.1)
Lymphs: 46 %
MCH: 29.2 pg (ref 26.6–33.0)
MCHC: 33.3 g/dL (ref 31.5–35.7)
MCV: 88 fL (ref 79–97)
Monocytes Absolute: 0.3 x10E3/uL (ref 0.1–0.9)
Monocytes: 7 %
Neutrophils Absolute: 2.1 x10E3/uL (ref 1.4–7.0)
Neutrophils: 44 %
Platelets: 275 x10E3/uL (ref 150–450)
RBC: 4.73 x10E6/uL (ref 3.77–5.28)
RDW: 12.4 % (ref 11.7–15.4)
WBC: 4.7 x10E3/uL (ref 3.4–10.8)

## 2024-02-11 LAB — COMPREHENSIVE METABOLIC PANEL WITH GFR
ALT: 68 IU/L — ABNORMAL HIGH (ref 0–32)
AST: 43 IU/L — ABNORMAL HIGH (ref 0–40)
Albumin: 4.5 g/dL (ref 3.9–4.9)
Alkaline Phosphatase: 62 IU/L (ref 41–116)
BUN/Creatinine Ratio: 18 (ref 9–23)
BUN: 14 mg/dL (ref 6–24)
Bilirubin Total: 1.2 mg/dL (ref 0.0–1.2)
CO2: 22 mmol/L (ref 20–29)
Calcium: 9.3 mg/dL (ref 8.7–10.2)
Chloride: 104 mmol/L (ref 96–106)
Creatinine, Ser: 0.78 mg/dL (ref 0.57–1.00)
Globulin, Total: 2.3 g/dL (ref 1.5–4.5)
Glucose: 93 mg/dL (ref 70–99)
Potassium: 4.4 mmol/L (ref 3.5–5.2)
Sodium: 140 mmol/L (ref 134–144)
Total Protein: 6.8 g/dL (ref 6.0–8.5)
eGFR: 98 mL/min/1.73

## 2024-02-11 LAB — LIPID PANEL
Chol/HDL Ratio: 2.9 ratio (ref 0.0–4.4)
Cholesterol, Total: 190 mg/dL (ref 100–199)
HDL: 66 mg/dL
LDL Chol Calc (NIH): 114 mg/dL — ABNORMAL HIGH (ref 0–99)
Triglycerides: 55 mg/dL (ref 0–149)
VLDL Cholesterol Cal: 10 mg/dL (ref 5–40)

## 2024-03-18 ENCOUNTER — Encounter: Admitting: Medical-Surgical

## 2025-01-24 ENCOUNTER — Encounter: Admitting: Medical-Surgical
